# Patient Record
Sex: Male | Born: 1965 | Race: White | Hispanic: No | Marital: Married | Smoking: Former smoker
Health system: Southern US, Community
[De-identification: ages and names within clinical notes are randomized; demographics above are authoritative.]

## PROBLEM LIST (undated history)

## (undated) DIAGNOSIS — B019 Varicella without complication: Secondary | ICD-10-CM

## (undated) DIAGNOSIS — Z8669 Personal history of other diseases of the nervous system and sense organs: Secondary | ICD-10-CM

## (undated) DIAGNOSIS — IMO0002 Reserved for concepts with insufficient information to code with codable children: Secondary | ICD-10-CM

## (undated) DIAGNOSIS — F419 Anxiety disorder, unspecified: Secondary | ICD-10-CM

## (undated) HISTORY — DX: Varicella without complication: B01.9

## (undated) HISTORY — DX: Personal history of other diseases of the nervous system and sense organs: Z86.69

## (undated) HISTORY — DX: Reserved for concepts with insufficient information to code with codable children: IMO0002

## (undated) HISTORY — DX: Anxiety disorder, unspecified: F41.9

---
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
## 1993-06-18 HISTORY — PX: BACK SURGERY: SHX140

## 1997-06-18 HISTORY — PX: APPENDECTOMY: SHX54

## 1998-04-25 ENCOUNTER — Inpatient Hospital Stay (HOSPITAL_COMMUNITY): Admission: EM | Admit: 1998-04-25 | Discharge: 1998-04-27 | Payer: Self-pay | Admitting: Emergency Medicine

## 2001-07-11 ENCOUNTER — Ambulatory Visit (HOSPITAL_BASED_OUTPATIENT_CLINIC_OR_DEPARTMENT_OTHER): Admission: RE | Admit: 2001-07-11 | Discharge: 2001-07-11 | Payer: Self-pay | Admitting: Urology

## 2001-07-11 ENCOUNTER — Encounter (INDEPENDENT_AMBULATORY_CARE_PROVIDER_SITE_OTHER): Payer: Self-pay | Admitting: *Deleted

## 2002-02-22 ENCOUNTER — Emergency Department (HOSPITAL_COMMUNITY): Admission: EM | Admit: 2002-02-22 | Discharge: 2002-02-22 | Payer: Self-pay | Admitting: *Deleted

## 2006-01-09 ENCOUNTER — Ambulatory Visit: Payer: Self-pay | Admitting: Family Medicine

## 2006-02-07 ENCOUNTER — Ambulatory Visit: Payer: Self-pay | Admitting: Family Medicine

## 2006-05-03 ENCOUNTER — Ambulatory Visit: Payer: Self-pay | Admitting: Family Medicine

## 2006-07-17 DIAGNOSIS — F411 Generalized anxiety disorder: Secondary | ICD-10-CM | POA: Insufficient documentation

## 2006-10-24 ENCOUNTER — Ambulatory Visit: Payer: Self-pay | Admitting: Family Medicine

## 2007-06-10 ENCOUNTER — Telehealth (INDEPENDENT_AMBULATORY_CARE_PROVIDER_SITE_OTHER): Payer: Self-pay | Admitting: *Deleted

## 2007-07-09 ENCOUNTER — Ambulatory Visit: Payer: Self-pay | Admitting: Family Medicine

## 2007-07-09 DIAGNOSIS — H833X9 Noise effects on inner ear, unspecified ear: Secondary | ICD-10-CM

## 2008-03-16 ENCOUNTER — Ambulatory Visit: Payer: Self-pay | Admitting: Internal Medicine

## 2008-03-16 DIAGNOSIS — H8110 Benign paroxysmal vertigo, unspecified ear: Secondary | ICD-10-CM

## 2008-04-11 ENCOUNTER — Emergency Department (HOSPITAL_COMMUNITY): Admission: EM | Admit: 2008-04-11 | Discharge: 2008-04-12 | Payer: Self-pay | Admitting: Emergency Medicine

## 2008-08-11 ENCOUNTER — Ambulatory Visit: Payer: Self-pay | Admitting: Internal Medicine

## 2009-04-05 ENCOUNTER — Telehealth: Payer: Self-pay | Admitting: Internal Medicine

## 2009-04-19 ENCOUNTER — Ambulatory Visit: Payer: Self-pay | Admitting: Internal Medicine

## 2009-04-19 DIAGNOSIS — M7710 Lateral epicondylitis, unspecified elbow: Secondary | ICD-10-CM | POA: Insufficient documentation

## 2009-11-18 ENCOUNTER — Ambulatory Visit: Payer: Self-pay | Admitting: Internal Medicine

## 2010-01-25 ENCOUNTER — Encounter: Admission: RE | Admit: 2010-01-25 | Discharge: 2010-01-25 | Payer: Self-pay | Admitting: Chiropractic Medicine

## 2010-04-26 ENCOUNTER — Telehealth: Payer: Self-pay | Admitting: Internal Medicine

## 2010-07-18 NOTE — Progress Notes (Signed)
Summary: needs appt  Phone Note Outgoing Call   Call placed by: Mervin Kung, CMA (AAMA) Call placed to: Patient Summary of Call: Received refill request for Citalopram. Pt is past due for follow up and needs appt.  Left message for pt to return my call. Nicki Guadalajara Fergerson CMA Duncan Dull)  April 26, 2010 10:02 AM   Follow-up for Phone Call        Left message on machine to return my call. Nicki Guadalajara Fergerson CMA Duncan Dull)  April 27, 2010 9:43 AM   Additional Follow-up for Phone Call Additional follow up Details #1::        call placed to patient at  [phone], male individual stated patient was not available. She was advised to have patient call to schedule follow up appointment Additional Follow-up by: Glendell Docker CMA,  April 28, 2010 11:35 AM

## 2010-11-03 NOTE — Op Note (Signed)
[facility]  Patient:    Dalton Moss, Dalton Moss Visit Number: [id number] MRN: [id number]          Service Type: NES Location: [facility] Attending Physician:  Liborio Nixon Dictated by:   Bertram Millard. Dahlstedt, M.D. Proc. Date: 07/11/01 Admit Date:  07/11/2001                             Operative Report  PREOPERATIVE DIAGNOSIS:  Left scrotal mass.  POSTOPERATIVE DIAGNOSIS:  Left scrotal mass.  PRINCIPLE PROCEDURE:  Excision of left scrotal mass.  SURGEON:  Bertram Millard. Dahlstedt, M.D.  ANESTHESIA:  General with LMA.  COMPLICATIONS:  None.  BRIEF HISTORY:  A 45 year old male status post vasectomy several years ago. He has had a persistent, enlarging, painful mass in his left hemiscrotum. This is palpable on a regular basis. He recently presented to me for evaluation. He requested removal of this, due to recurrent pain which is disabling at times. Surgery was discussed with the patient as well as risks and complications. He desires to proceed.  DESCRIPTION OF PROCEDURE:  The patient was administered a general anesthetic using the LMA. He was placed in the supine position. Genitalia and perineum were prepped and draped. A 1 1/2 cm incision was made over top of this left scrotal mass which was transfixed beneath the skin with towel clips. Dissection was carried through some dense subcutaneous tissue to this mass which was identified. It was approximately 1 x 1 1/2 cm in size. It was circumscribed with sharp dissection. Small bleeders were electrocoagulated. There was a small amount of brownish material coming from this mass which was cultured and sent for routine culture and sensitivity. Once the mass was excised, inspection and palpation revealed no further palpable or visual evidence of the mass left. The small bleeders were carefully electrocoagulated. There was a free end of the vas which was identified and sutured closed with 3-0 Vicryl  tie.  The cord was blocked with 0.5% plain Marcaine, 10 cc. Subcutaneous skin closure was performed using a running 4-0 Vicryl. Collodion, fluffs, and a jock strap were placed.  The patient tolerated the procedure well. He was awakened, extubated, and taken to the PACU in stable condition.  He will follow-up in my office in approximately two weeks. Dictated by:   Bertram Millard. Dahlstedt, M.D. Attending Physician:  Liborio Nixon DD:  07/11/01 TD:  07/12/01 Job: [id number]

## 2011-03-03 ENCOUNTER — Emergency Department (HOSPITAL_BASED_OUTPATIENT_CLINIC_OR_DEPARTMENT_OTHER)
Admission: EM | Admit: 2011-03-03 | Discharge: 2011-03-03 | Disposition: A | Discharge status: Home / Self Care | Payer: BC Managed Care – PPO | Attending: Emergency Medicine | Admitting: Emergency Medicine

## 2011-03-03 ENCOUNTER — Emergency Department (INDEPENDENT_AMBULATORY_CARE_PROVIDER_SITE_OTHER): Payer: BC Managed Care – PPO

## 2011-03-03 ENCOUNTER — Encounter: Payer: Self-pay | Admitting: Emergency Medicine

## 2011-03-03 DIAGNOSIS — R05 Cough: Secondary | ICD-10-CM

## 2011-03-03 DIAGNOSIS — J40 Bronchitis, not specified as acute or chronic: Secondary | ICD-10-CM | POA: Insufficient documentation

## 2011-03-03 MED ORDER — AZITHROMYCIN 250 MG PO TABS: ORAL_TABLET | ORAL | Status: DC

## 2011-03-03 NOTE — ED Provider Notes (Signed)
History     CSN: [id number] Arrival date & time: 03/03/2011 12:06 PM   Chief Complaint  Patient presents with  . Cough  . Pleurisy     (Include location/radiation/quality/duration/timing/severity/associated sxs/prior treatment) HPI  Patient states that he has had upper respiratory infection symptoms for several weeks. He has now developed a cough productive of dark sputum. He has had fever and chills especially at night. He states that many of his coworkers have had a similar syndrome. He is on a significant past medical history. History reviewed. No pertinent past medical history.   Past Surgical History  Procedure Date  . Back surgery     History reviewed. No pertinent family history.  History  Substance Use Topics  . Smoking status: Never Smoker   . Smokeless tobacco: Not on file  . Alcohol Use: 8.4 oz/week    14 Cans of beer per week      Review of Systems  All other systems reviewed and are negative.    Allergies  Review of patient's allergies indicates no known allergies.  Home Medications   Current Outpatient Rx  Name Route Sig Dispense Refill  . DEXTROMETHORPHAN-GUAIFENESIN 30-600 MG PO TB12 Oral Take 1 tablet by mouth every 12 (twelve) hours.      . MULTIVITAMINS PO CAPS Oral Take 1 capsule by mouth daily.        Physical Exam    BP 123/70  Pulse 71  Temp(Src) 97.1 F (36.2 C) (Oral)  Resp 16  Ht 6\' 2"  (1.88 m)  Wt 195 lb (88.451 kg)  BMI 25.04 kg/m2  SpO2 99%  Physical Exam  Vitals reviewed. Constitutional: He is oriented to person, place, and time. He appears well-developed and well-nourished.  HENT:  Head: Normocephalic.  Eyes: Conjunctivae are normal. Pupils are equal, round, and reactive to light.  Neck: Normal range of motion. Neck supple.  Cardiovascular: Normal rate.   Pulmonary/Chest: Effort normal.  Abdominal: Soft.  Musculoskeletal: Normal range of motion.  Neurological: He is alert and oriented to person, place, and time.   Skin: Skin is warm and dry.  Psychiatric: He has a normal mood and affect.    ED Course  Procedures  No results found for this or any previous visit. Dg Chest 2 View  03/03/2011  *RADIOLOGY REPORT*  Clinical Data: Cough  CHEST - 2 VIEW  Comparison: None.  Findings: Lungs are clear. No pleural effusion or pneumothorax.  Cardiomediastinal silhouette is within normal limits.  Visualized osseous structures are within normal limits.  IMPRESSION: Normal chest radiographs.  Original Report Authenticated By: Charline Bills, M.D.     No diagnosis found.   MDM        Hilario Quarry, MD 03/03/11 [phone]

## 2011-03-03 NOTE — ED Notes (Signed)
Pt states he has been having cold symptoms x 2 weeks.  Irritated throat, cough, nasal congestion, progressively worsening in to chest congestion, productive cough, productive brown sputum.  No known fever, no chills.

## 2011-05-07 ENCOUNTER — Telehealth: Payer: Self-pay | Admitting: *Deleted

## 2011-05-07 NOTE — Telephone Encounter (Signed)
Agree w/ Tamiflu.

## 2011-05-07 NOTE — Telephone Encounter (Signed)
riage Record Num: [id number] Operator: Dalton Moss Patient Name: Dalton Moss Call Date & Time: 05/05/2011 3:31:25PM Patient Phone: PCP: Dalton Moss Patient Gender: Male PCP Fax : [phone] Patient DOB: 1966-04-10 Practice Name: Dalton Moss Reason for Call: Caller: Dalton Moss/Spouse; PCP: Dalton Moss.; CB#: [phone]; Call Reason: Exposure To Flu; Sx Onset: 05/05/2011; Sx Notes: ; Afebrile; Wt: ; Guideline Used: ; Disp:; Appt Scheduled?: States son was seen at [facility]/05/11 and diagnosed with positive influenza A. Child was prescribed TamiFlu, and provider suggested family call PCP for coverage with TamiFlu. Denies current medications, allergies, or s/sx influenza. TC to Dalton Moss; Rx for Tamiflu 75 mg 1 po qd x 10 days, #10, NR called in to [facility]/[address] [phone]. Protocol(s) Used: Flu-Like Symptoms Recommended Outcome per Protocol: Provide Home/Self Care Reason for Outcome: Has questions/concerns about exposure to influenza Care Advice: ~ HEALTH PROMOTION / MAINTENANCE Influenza - Expected Course: - Symptoms start to improve in 3 to 7 days. - Cough and feeling tired (malaise) may continue for several weeks. - Cough and extreme fatigue lasting more than 3 weeks needs medical evaluation. - Remain at home at least 24 hours after being free of fever 100F (37.8C) without the use of fever reducing medication. ~ Influenza - Transmission: - Flu virus is spread through the air in droplets when a flu-infected person coughs, sneezes or talks and another person breathes in the droplets, or by touching a surface like a door knob, telephone, or keyboard that has been contaminated by the droplets and then touching the mouth, nose, or eyes. - An individual may be passing on the flu before they have any symptoms. - An individual may continue to be contagious with the flu for up to 7 days after the symptoms start. H1N1 influenza may continue  to be contagious as long as cough persists. ~ Influenza Prevention - Good Health Habits: - Practice good health habits: Get 7-8 hours of sleep each night. Eat healthy foods and drink sugar-free fluids. Exercise most days of a week and manage your stress. - Practice prevention by covering your mouth and nose with a tissue when sneezing or coughing and throwing the tissue into the trash after one use. Wash your hands often or use an alcohol-based gel or wipe. Avoid touching your eyes, nose and mouth. - Be sure to keep your immunizations up to date. - Avoid crowds during peak flu season. - Stay at home when not feeling well and avoid close contact with people who are sick. ~ Influenza Prevention - Personal Hygiene: - Wash your hands with soap and water often, for at least 15 seconds, especially after you cough or sneeze or when you have touched a contaminated surface/object (door knob, telephone, etc.) - If soap and water are not available, use an alcohol-based hand cleaner containing at least 60% alcohol, rub hands together until hands are dry. - Avoid putting your hands and fingers to the face, nose, mouth or eyes. - During the flu season avoid crowded places (shopping areas, planes, sporting events). ~ 05/05/2011 3:55:05PM Page 1 of 2 [id number] Call-A-Nurse Triage Call Report Patient Name: Dalton Moss continuation page/s Do not go to work, school, or any community activity until you have not had a fever (100F or 37.8C) for at least 24 hours without taking fever-reducing medication. This means staying at home for at least 3 to 5, possibly 7 days. ~ To help prevent a widespread community outbreak of the flu, call the call center, your  clinic or provider's office to discuss any questions or concerns before going in unless you have severe respiratory or any nervous system symptoms. ~ Influenza - Face Masks Disposable face masks should be used by: - People with flu-like symptoms  when in close contact with others in the home, if breastfeeding a baby, or when leaving the home for medical care - People at high risk for flu-related complications * Disposable face masks are available at pharmacies and hardware or building supply stores * Used face masks can be thrown away in the regular trash. Wash hands with soap and water or with alcohol-based gel after removing a face mask. ~ Antiviral medications - Prescribed medications are available in pills, liquids or inhaled powder that help prevent or lessen symptoms of viral illnesses. Antivirals are usually given to persons at a higher risk for flu-related complications or who are very ill. Most healthy people do not need to be treated with antiviral drugs. - Recommended medications for this season: oseltamivir (Tamiflu) and zanamivir (Relenza). - Work best when started within the first two days of symptoms. - Speak with your provider as soon as possible if exposed to the flu or if you have new symptoms of the flu. ~ Influenza Prevention - Immunization: * Vaccination each season is the best protection against getting the flu and its complications. The most common complication of influenza is pneumonia. * CDC recommends annual flu vaccine for everyone 6 months and older. Flu viruses change quickly so last year's vaccine may not protect you from this year's virus. * Flu vaccine is available in two forms, a shot or a nasal spray. - The shot contains killed viruses so you can't pass the flu to anyone else. It is approved for everyone age 7 months and older. - The nasal spray contains weakened live flu viruses that won't give you the flu but in rare cases can be passed to others. It is approved for healthy persons ages 2 to 56 years. It should NOT be given to pregnant women, those with a chronic medical condition, a weakened immune system, or to children and adolescents receiving aspirin therapy. * DO NOT get a flu shot if  you: - Have had a severe allergic reaction to the vaccine in the past - Have an allergy to chicken eggs - Have a fever that day * Full effect of the vaccination takes two weeks. If you are exposed to the flu virus during the two weeks you may catch the flu. If this year's vaccine does not match the flu viruses you are exposed to during this flu season, the flu shot won't protect you. ~ 11/

## 2011-05-29 ENCOUNTER — Ambulatory Visit: Payer: BC Managed Care – PPO | Admitting: Internal Medicine

## 2011-05-31 ENCOUNTER — Ambulatory Visit: Payer: BC Managed Care – PPO | Admitting: Internal Medicine

## 2011-10-15 ENCOUNTER — Encounter: Payer: Self-pay | Admitting: Internal Medicine

## 2011-10-15 ENCOUNTER — Ambulatory Visit: Payer: BC Managed Care – PPO | Admitting: Internal Medicine

## 2011-10-16 ENCOUNTER — Ambulatory Visit (INDEPENDENT_AMBULATORY_CARE_PROVIDER_SITE_OTHER): Payer: BC Managed Care – PPO | Admitting: Internal Medicine

## 2011-10-16 ENCOUNTER — Encounter: Payer: Self-pay | Admitting: Internal Medicine

## 2011-10-16 VITALS — BP 104/60 | HR 65 | Temp 97.9°F | Ht 74.0 in | Wt 189.0 lb

## 2011-10-16 DIAGNOSIS — Z13220 Encounter for screening for lipoid disorders: Secondary | ICD-10-CM

## 2011-10-16 DIAGNOSIS — R5381 Other malaise: Secondary | ICD-10-CM

## 2011-10-16 DIAGNOSIS — R5383 Other fatigue: Secondary | ICD-10-CM

## 2011-10-16 DIAGNOSIS — M7989 Other specified soft tissue disorders: Secondary | ICD-10-CM

## 2011-10-16 DIAGNOSIS — F411 Generalized anxiety disorder: Secondary | ICD-10-CM

## 2011-10-16 DIAGNOSIS — M799 Soft tissue disorder, unspecified: Secondary | ICD-10-CM

## 2011-10-16 LAB — CBC WITH DIFFERENTIAL/PLATELET
Basophils Relative: 0 % (ref 0–1)
Eosinophils Absolute: 0.1 10*3/uL (ref 0.0–0.7)
HCT: 40.9 % (ref 39.0–52.0)
Hemoglobin: 13.8 g/dL (ref 13.0–17.0)
Lymphs Abs: 1.6 10*3/uL (ref 0.7–4.0)
MCV: 93.2 fL (ref 78.0–100.0)
Monocytes Absolute: 0.4 10*3/uL (ref 0.1–1.0)
Neutrophils Relative %: 41 % — ABNORMAL LOW (ref 43–77)
RBC: 4.39 MIL/uL (ref 4.22–5.81)
RDW: 12.2 % (ref 11.5–15.5)
WBC: 3.4 10*3/uL — ABNORMAL LOW (ref 4.0–10.5)

## 2011-10-16 LAB — LIPID PANEL
Total CHOL/HDL Ratio: 3 Ratio
VLDL: 15 mg/dL (ref 0–40)

## 2011-10-16 LAB — HEPATIC FUNCTION PANEL
ALT: 29 U/L (ref 0–53)
AST: 32 U/L (ref 0–37)
Alkaline Phosphatase: 39 U/L (ref 39–117)
Bilirubin, Direct: 0.2 mg/dL (ref 0.0–0.3)
Indirect Bilirubin: 0.5 mg/dL (ref 0.0–0.9)
Total Bilirubin: 0.7 mg/dL (ref 0.3–1.2)
Total Protein: 7 g/dL (ref 6.0–8.3)

## 2011-10-16 LAB — TSH: TSH: 1.968 u[IU]/mL (ref 0.350–4.500)

## 2011-10-16 MED ORDER — ZOLPIDEM TARTRATE 5 MG PO TABS: 5.0000 mg | ORAL_TABLET | Freq: Every evening | ORAL | Status: DC | PRN

## 2011-10-16 MED ORDER — CITALOPRAM HYDROBROMIDE 20 MG PO TABS: 20.0000 mg | ORAL_TABLET | Freq: Every day | ORAL | Status: DC

## 2011-10-16 NOTE — Progress Notes (Signed)
  Subjective:    Patient ID: Dalton Moss, male    DOB: 10/29/1965, 46 y.o.   MRN: [id number]  HPI Pt presents to clinic for followup of multiple medical problems. Notes lump on right arm described as size of a peanut for ~7 months. No pain, irritation or drainage. No changes in lump noted. H/o anxiety in past and was on celexa previously. Notes decreased sleep. Uses ibuprofen regularly for back pain. States drink etoh on a regular basis. Has generalized fatigue.   Past Medical History  Diagnosis Date  . Anxiety   . History of tinnitus     & hearing loss- Followed by ENT-Dr. Annalee Genta  . Positional vertigo    Past Surgical History  Procedure Date  . Back surgery 1995    x 2  . Appendectomy 1999  . Tonsillectomy 1973    reports that he has quit smoking. He has never used smokeless tobacco. He reports that he drinks about 8.4 ounces of alcohol per week. He reports that he does not use illicit drugs. family history is negative for Heart disease, and Stroke, and Diabetes, and Hypertension, and Breast cancer, and Colon cancer, and Prostate cancer, . No Known Allergies    Review of Systems see hpi     Objective:   Physical Exam  Nursing note and vitals reviewed. Constitutional: He appears well-developed and well-nourished.  HENT:  Head: Normocephalic and atraumatic.  Eyes: Conjunctivae are normal. No scleral icterus.  Neck: Neck supple.  Cardiovascular: Normal rate, regular rhythm and normal heart sounds.   Pulmonary/Chest: Effort normal and breath sounds normal.  Neurological: He is alert.  Skin: Skin is warm and dry.       Right arm: small ~50mm st mass. Mobile, smooth/well circumscribed and nt.  Psychiatric: He has a normal mood and affect. His behavior is normal.          Assessment & Plan:

## 2011-10-21 DIAGNOSIS — M7989 Other specified soft tissue disorders: Secondary | ICD-10-CM | POA: Insufficient documentation

## 2011-10-21 DIAGNOSIS — R5383 Other fatigue: Secondary | ICD-10-CM | POA: Insufficient documentation

## 2011-10-21 NOTE — Assessment & Plan Note (Signed)
Resume celexa. Wean etoh. Follow up one month or sooner if needed.

## 2011-10-21 NOTE — Assessment & Plan Note (Signed)
Obtain cbc, chem7, tsh, testosterone and lft.

## 2011-10-21 NOTE — Assessment & Plan Note (Signed)
Exam suggests cyst vs lipoma. Observation recommended.

## 2011-10-24 ENCOUNTER — Telehealth: Payer: Self-pay | Admitting: *Deleted

## 2011-10-24 NOTE — Telephone Encounter (Signed)
Notes Recorded by Edwyna Perfect, MD on 10/22/2011 at 9:24 PM Labs ok. Missing chem7  Patient's daughter states Pt out of town; informed her that we are still waiting on [1] of the lab results, but what has been received were WNR, and that we would call back after we have researched the other lab results [BMET still stating 'needs to be collected']? Could you please check w/your lab on this & inform patient. Thanks.

## 2011-10-25 NOTE — Telephone Encounter (Signed)
Call placed to patient at [phone], male identified herself as his wife, she stated patient is out of town and will return tomorrow. Message was left for patient to return phone call.

## 2011-11-20 ENCOUNTER — Ambulatory Visit (INDEPENDENT_AMBULATORY_CARE_PROVIDER_SITE_OTHER): Payer: BC Managed Care – PPO | Admitting: Internal Medicine

## 2011-11-20 VITALS — BP 110/72 | HR 63 | Temp 98.3°F | Resp 18 | Wt 186.0 lb

## 2011-11-20 DIAGNOSIS — F411 Generalized anxiety disorder: Secondary | ICD-10-CM

## 2011-11-20 DIAGNOSIS — R5381 Other malaise: Secondary | ICD-10-CM

## 2011-11-20 DIAGNOSIS — R7989 Other specified abnormal findings of blood chemistry: Secondary | ICD-10-CM

## 2011-11-20 DIAGNOSIS — E291 Testicular hypofunction: Secondary | ICD-10-CM

## 2011-11-20 DIAGNOSIS — R5383 Other fatigue: Secondary | ICD-10-CM

## 2011-11-20 LAB — BASIC METABOLIC PANEL
Calcium: 9.7 mg/dL (ref 8.4–10.5)
Creat: 0.96 mg/dL (ref 0.50–1.35)
Potassium: 4.6 mEq/L (ref 3.5–5.3)
Sodium: 141 mEq/L (ref 135–145)

## 2011-11-21 ENCOUNTER — Encounter: Payer: Self-pay | Admitting: Internal Medicine

## 2011-11-21 DIAGNOSIS — R7989 Other specified abnormal findings of blood chemistry: Secondary | ICD-10-CM | POA: Insufficient documentation

## 2011-11-21 LAB — TESTOSTERONE, FREE, TOTAL, SHBG
Sex Hormone Binding: 41 nmol/L (ref 13–71)
Testosterone: 567.07 ng/dL (ref 300–890)

## 2011-11-21 NOTE — Assessment & Plan Note (Signed)
Improving. Continue celexa at current dose.

## 2011-11-21 NOTE — Progress Notes (Signed)
  Subjective:    Patient ID: Dalton Moss, male    DOB: September 25, 1965, 46 y.o.   MRN: [id number]  HPI Pt presents to clinic for followup of multiple medical problems. Anxiety better with celexa. No side effects. Testosterone obtained due to fatigue and found to be low nl. No h/o hypogonadism or testosterone replacement.  Past Medical History  Diagnosis Date  . Anxiety   . History of tinnitus     & hearing loss- Followed by ENT-Dr. Annalee Moss  . Positional vertigo    Past Surgical History  Procedure Date  . Back surgery 1995    x 2  . Appendectomy 1999  . Tonsillectomy 1973    reports that he has quit smoking. He has never used smokeless tobacco. He reports that he drinks about 8.4 ounces of alcohol per week. He reports that he does not use illicit drugs. family history is negative for Heart disease, and Stroke, and Diabetes, and Hypertension, and Breast cancer, and Colon cancer, and Prostate cancer, . No Known Allergies    Review of Systems see hpi     Objective:   Physical Exam  Nursing note and vitals reviewed. Constitutional: He appears well-developed and well-nourished. No distress.  Neurological: He is alert.  Skin: He is not diaphoretic.  Psychiatric: He has a normal mood and affect.          Assessment & Plan:

## 2011-11-21 NOTE — Assessment & Plan Note (Signed)
Obtain free testosterone

## 2011-12-19 ENCOUNTER — Telehealth: Payer: Self-pay | Admitting: Internal Medicine

## 2011-12-19 MED ORDER — ZOLPIDEM TARTRATE 5 MG PO TABS: 5.0000 mg | ORAL_TABLET | Freq: Every evening | ORAL | Status: DC | PRN

## 2011-12-19 NOTE — Telephone Encounter (Signed)
OK to send #30 with no refills. 

## 2011-12-19 NOTE — Telephone Encounter (Signed)
Refill left on pharmacy voicemail. 

## 2011-12-19 NOTE — Telephone Encounter (Signed)
Zolpidem request [last refill 04.30.13 #[id number]]/SLS Please advise in Dr. Ilda Foil absence. Thanks.

## 2011-12-19 NOTE — Telephone Encounter (Signed)
Refill-zolpidem 5mg  tablets. Take one tablet by mouth every night at bedtime as needed for sleep. Qty 30 last fill 4.30.13

## 2012-02-19 ENCOUNTER — Ambulatory Visit: Payer: BC Managed Care – PPO | Admitting: Internal Medicine

## 2012-03-12 ENCOUNTER — Telehealth: Payer: Self-pay | Admitting: Internal Medicine

## 2012-03-12 NOTE — Telephone Encounter (Signed)
Refill- zolpidem 5mg  tablets. Take one tablet by mouth every night at bedtime as needed for sleep. Qty 30 last fill 7.3.13

## 2012-03-13 ENCOUNTER — Ambulatory Visit: Payer: BC Managed Care – PPO | Admitting: Internal Medicine

## 2012-03-13 MED ORDER — ZOLPIDEM TARTRATE 5 MG PO TABS: 5.0000 mg | ORAL_TABLET | Freq: Every evening | ORAL | Status: DC | PRN

## 2012-03-13 NOTE — Telephone Encounter (Signed)
#  [phone] [id number] 

## 2012-03-13 NOTE — Telephone Encounter (Signed)
Rx to pharmacy/SLS 

## 2012-07-14 ENCOUNTER — Encounter: Payer: Self-pay | Admitting: Family

## 2012-07-14 ENCOUNTER — Ambulatory Visit (INDEPENDENT_AMBULATORY_CARE_PROVIDER_SITE_OTHER): Payer: BC Managed Care – PPO | Admitting: Family

## 2012-07-14 VITALS — BP 122/72 | HR 77 | Temp 98.9°F | Resp 16 | Wt 191.0 lb

## 2012-07-14 DIAGNOSIS — H6690 Otitis media, unspecified, unspecified ear: Secondary | ICD-10-CM

## 2012-07-14 MED ORDER — AMOXICILLIN-POT CLAVULANATE 875-125 MG PO TABS: 1.0000 | ORAL_TABLET | Freq: Two times a day (BID) | ORAL | Status: DC

## 2012-07-14 NOTE — Assessment & Plan Note (Signed)
R OM and possible early sinusitis.  Will rx with Augmentin.  Pt is instructed to call if symptoms worsen or if no improvement in 3 days. Pt verbalizes understanding.

## 2012-07-14 NOTE — Patient Instructions (Addendum)
Please call if symptoms worsen or if no improvement in 3 days.

## 2012-07-14 NOTE — Progress Notes (Signed)
  Subjective:    Patient ID: Dalton Moss, male    DOB: 1965-08-29, 47 y.o.   MRN: [id number]  HPI  Dalton Moss is a 47 yr old male who presents today with chief complaint of cough.  He reports associated fatigue, sore throat, low grade fever and nasal congestion which started on Thursday.  He does have some colored nasal discharge.  He has tried dayquil, tylenol, mucinex without improvement.  Review of Systems    see HPI  Past Medical History  Diagnosis Date  . Anxiety   . History of tinnitus     & hearing loss- Followed by ENT-Dr. Annalee Genta  . Positional vertigo     History   Social History  . Marital Status: Married    Spouse Name: N/A    Number of Children: N/A  . Years of Education: N/A   Occupational History  . Not on file.   Social History Main Topics  . Smoking status: Former Games developer  . Smokeless tobacco: Never Used     Comment: history of 1 ppd for 7-8 years. Currently using nicotine gum to assist stopping chewing tobacco.  . Alcohol Use: 8.4 oz/week    14 Cans of beer per week  . Drug Use: No  . Sexually Active: Not on file   Other Topics Concern  . 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 Back surgery 1995    x 2  . Appendectomy 1999  . Tonsillectomy 1973    Family History  Problem Relation Age of Onset  . Heart disease Neg Hx   . Stroke Neg Hx   . Diabetes Neg Hx   . Hypertension Neg Hx   . Breast cancer Neg Hx   . Colon cancer Neg Hx   . Prostate cancer Neg Hx     No Known Allergies  Current Outpatient Prescriptions on File Prior to Visit  Medication Sig Dispense Refill  . citalopram (CELEXA) 20 MG tablet Take 1 tablet (20 mg total) by mouth daily.  30 tablet  11  . Multiple Vitamin (MULTIVITAMIN) capsule Take 1 capsule by mouth daily.        Marland Kitchen zolpidem (AMBIEN) 5 MG tablet Take 1 tablet (5 mg total) by mouth at bedtime as needed  for sleep.  30 tablet  3    BP 122/72  Pulse 77  Temp 98.9 F (37.2 C) (Oral)  Resp 16  Wt 191 lb 0.6 oz (86.655 kg)  SpO2 97%    Objective:   Physical Exam  Constitutional: He is oriented to person, place, and time. He appears well-developed and well-nourished. No distress.  HENT:  Head: Normocephalic and atraumatic.  Right Ear: Ear canal normal. Tympanic membrane is erythematous.  Left Ear: Tympanic membrane and ear canal normal.  Mouth/Throat: No oropharyngeal exudate or posterior oropharyngeal edema.  Cardiovascular: Normal rate and regular rhythm.   No murmur heard. Pulmonary/Chest: Effort normal and breath sounds normal. No respiratory distress. He has no wheezes. He has no rales. He exhibits no tenderness.  Musculoskeletal: He exhibits no edema.  Neurological: He is alert and oriented to person, place, and time.  Psychiatric: He has a normal mood and affect. His behavior is normal. Judgment and thought content normal.          Assessment & Plan:

## 2012-10-09 ENCOUNTER — Telehealth: Payer: Self-pay | Admitting: Internal Medicine

## 2012-10-09 MED ORDER — CITALOPRAM HYDROBROMIDE 20 MG PO TABS: 20.0000 mg | ORAL_TABLET | Freq: Every day | ORAL | Status: DC

## 2012-10-09 NOTE — Telephone Encounter (Signed)
Refill- citalopram 20mg  tablets. Take one tablet by mouth daily. Qty 30 last fill 3.19.14

## 2013-03-13 ENCOUNTER — Other Ambulatory Visit: Payer: Self-pay | Admitting: Family Medicine

## 2013-03-26 ENCOUNTER — Ambulatory Visit (INDEPENDENT_AMBULATORY_CARE_PROVIDER_SITE_OTHER): Payer: BC Managed Care – PPO | Admitting: Physician Assistant

## 2013-03-26 ENCOUNTER — Encounter: Payer: Self-pay | Admitting: Physician Assistant

## 2013-03-26 VITALS — BP 122/82 | HR 59 | Temp 98.5°F | Resp 16 | Ht 73.0 in | Wt 205.0 lb

## 2013-03-26 DIAGNOSIS — F411 Generalized anxiety disorder: Secondary | ICD-10-CM

## 2013-03-26 DIAGNOSIS — Z299 Encounter for prophylactic measures, unspecified: Secondary | ICD-10-CM | POA: Insufficient documentation

## 2013-03-26 DIAGNOSIS — F419 Anxiety disorder, unspecified: Secondary | ICD-10-CM | POA: Insufficient documentation

## 2013-03-26 MED ORDER — CITALOPRAM HYDROBROMIDE 20 MG PO TABS: ORAL_TABLET | ORAL | Status: DC

## 2013-03-26 NOTE — Assessment & Plan Note (Signed)
Patient to return for fasting lab work. Patient also instructed to set up appointment day and time for complete physical examination. Last complete physical examination was 1.5 years ago

## 2013-03-26 NOTE — Assessment & Plan Note (Signed)
Refill citalopram 20 mg. Patient is well controlled on current therapy.  Patient is to obtain fasting lab work.

## 2013-03-26 NOTE — Progress Notes (Signed)
Patient ID: Dalton Moss, male   DOB: Feb 19, 1966, 47 y.o.   MRN: [id number]  Patient is a 47 year old Caucasian gentleman with history of anxiety who presents to the clinic today to transfer care for medication management.    Patient states his anxiety has been well controlled with escitalopram 20 mg tablet daily. States he's been on medication for several years. Denies GI upset or sexual dysfunction.  Denies symptoms of depression including depressed mood and anhedonia. Denies panic attack. States that his anxiety has been more generalized in the past. Anxiety stems from constant stress with work. Patient does own business.  Patient is overdue for complete physical exam and fasting lab work.  Discussed with patient that lab work will need to be obtained in order for [place] to continue his chronic medication management.   Patient also has has a history of mild insomnia associated with his anxiety and stress from work. Has prescription for Ambien 5 mg to take each night at bedtime. Patient states that he rarely takes this medication. States the Ambien does not help because his lack of sleep stems from constant demand at work and phone calls throughout the night.   Past Medical History  Diagnosis Date  . Anxiety   . History of tinnitus     & hearing loss- Followed by ENT-Dr. Annalee Genta  . Positional vertigo   . Chicken pox     Current Outpatient Prescriptions on File Prior to Visit  Medication Sig Dispense Refill  . Multiple Vitamin (MULTIVITAMIN) capsule Take 1 capsule by mouth daily.        Marland Kitchen zolpidem (AMBIEN) 5 MG tablet Take 1 tablet (5 mg total) by mouth at bedtime as needed for sleep.  30 tablet  3   No current facility-administered medications on file prior to visit.    No Known Allergies  Family History  Problem Relation Age of Onset  . Heart disease Neg Hx   . Stroke Neg Hx   . Diabetes Neg Hx   . Hypertension Neg Hx   . Breast cancer Neg Hx   . Colon cancer Neg Hx   . Prostate  cancer Neg Hx     History   Social History  . Marital Status: Married    Spouse Name: N/A    Number of Children: N/A  . Years of Education: N/A   Social History Main Topics  . Smoking status: Former Games developer  . Smokeless tobacco: Current User    Types: Chew     Comment: history of 1 ppd for 7-8 years. Currently using nicotine gum to assist stopping chewing tobacco.  . Alcohol Use: 8.4 oz/week    14 Cans of beer per week  . Drug Use: No  . Sexual Activity: None   Other Topics Concern  . 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 All other review of systems negative.    Filed Vitals:   03/26/13 0926  BP: 122/82  Pulse: 59  Temp: 98.5 F (36.9 C)  Resp: 16   Physical Exam  Constitutional: He is oriented to person, place, and time and well-developed, well-nourished, and in no distress.  HENT:  Head: Normocephalic and atraumatic.  Eyes: Conjunctivae are normal.  Neck: Neck supple. No thyromegaly present.  Cardiovascular: Normal rate, regular rhythm and normal heart sounds.   Pulmonary/Chest: Effort normal and  breath sounds normal. No respiratory distress. He has no wheezes. He has no rales. He exhibits no tenderness.  Neurological: He is alert and oriented to person, place, and time.  Skin: Skin is warm and dry. No rash noted.  Psychiatric: Affect normal.   Assessment/Plan: Anxiety Refill citalopram 20 mg. Patient is well controlled on current therapy.  Patient is to obtain fasting lab work.  Preventive measure Patient to return for fasting lab work. Patient also instructed to set up appointment day and time for complete physical examination. Last complete physical examination was 1.5 years ago

## 2013-03-26 NOTE — Patient Instructions (Signed)
Please return to the office for lab work.  I will need to obtain these if we are to continue prescribing medications.  I will call you with your results.  Please schedule an appointment for a complete physical within the next month.

## 2013-03-30 ENCOUNTER — Other Ambulatory Visit: Payer: Self-pay | Admitting: Family Medicine

## 2013-04-01 NOTE — Telephone Encounter (Signed)
eScribe request for refill on Citalopram Last filled - 09.26.14, #15x0 [Needed OV] Last AEX - 10.09.14 Next AEX - 1 Month/CPE Refill sent per [facility] refill protocol/SLS

## 2013-05-07 IMAGING — CR DG CHEST 2V
2 series · 2 of 2 positions shown · non-contrast
Comparison: None.

CLINICAL DATA: Cough

CHEST - 2 VIEW

[w chest pa]
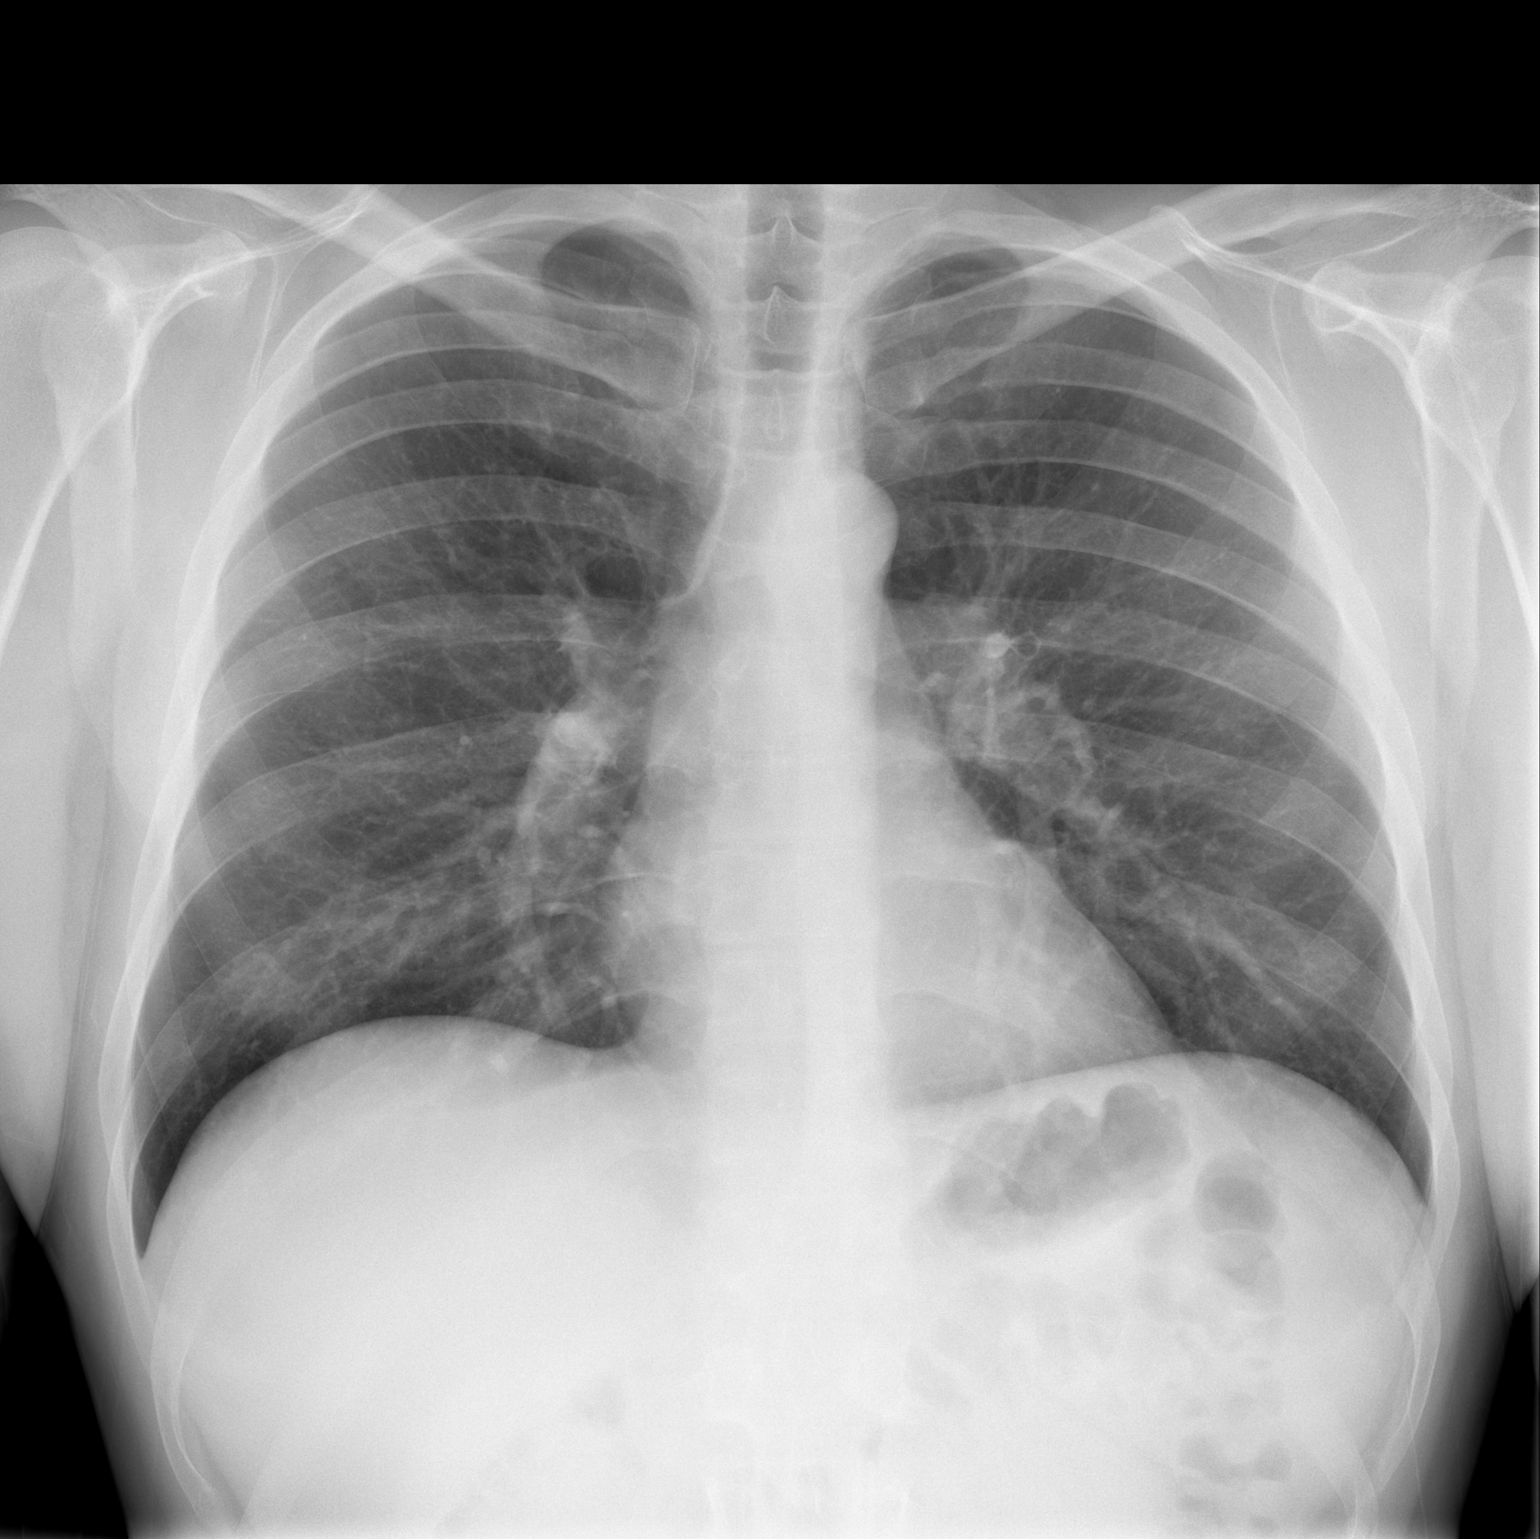

[w chest lat]
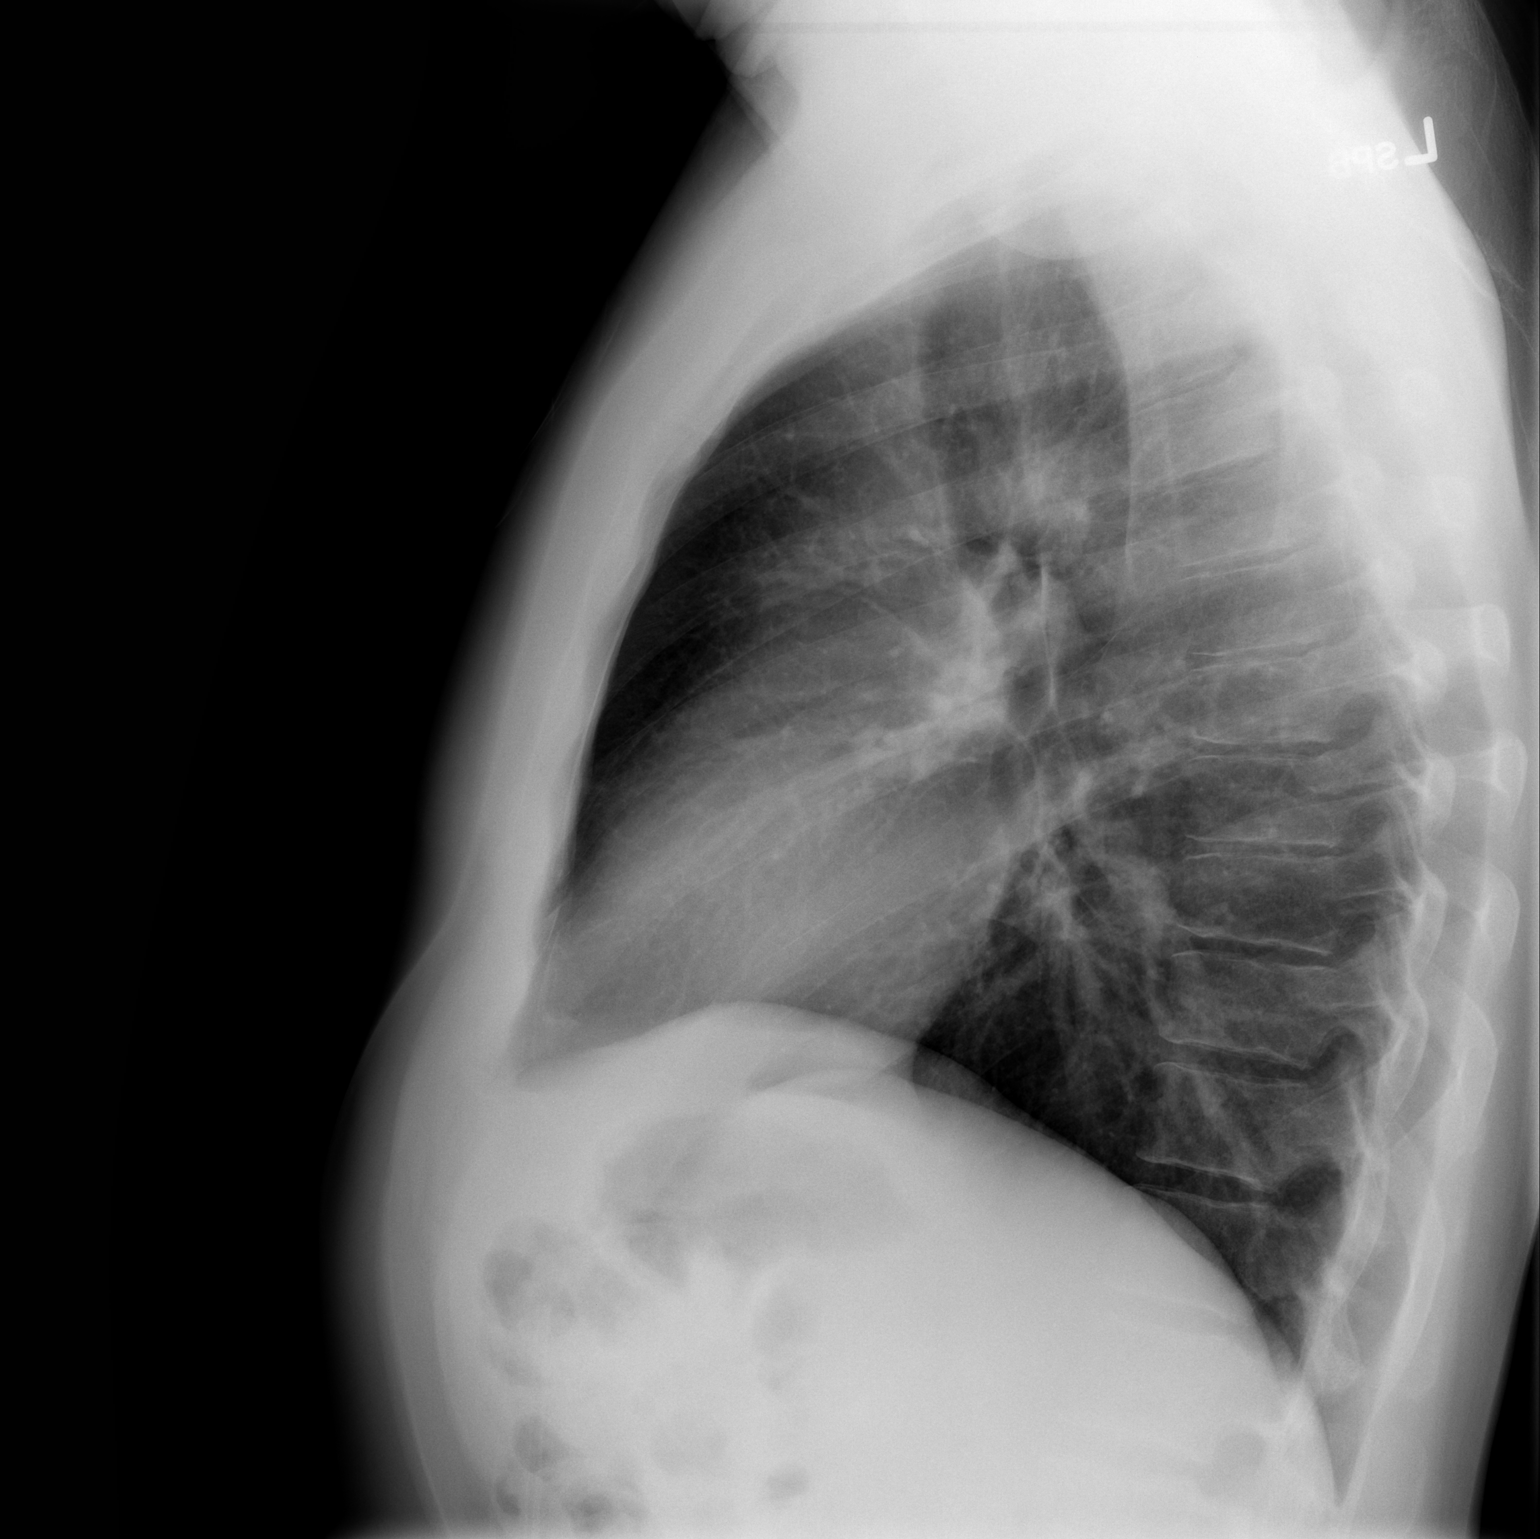

[2 of 2 positions shown; findings below may reference images not displayed]

FINDINGS: Lungs are clear. No pleural effusion or pneumothorax.

Cardiomediastinal silhouette is within normal limits.

Visualized osseous structures are within normal limits.
IMPRESSION: Normal chest radiographs.

## 2013-08-29 ENCOUNTER — Other Ambulatory Visit: Payer: Self-pay | Admitting: Physician Assistant

## 2013-08-31 NOTE — Telephone Encounter (Signed)
[facility] 20 mg Last filled - 10.13.14, #30x1 Last AEX - 10.09.14; NP transfer Next AEX - 4 weeks for CPE; no appointment made and/or kept & pt has not done labs from 10.09.14 appt. Please Advise/SLS

## 2013-09-28 ENCOUNTER — Other Ambulatory Visit: Payer: Self-pay | Admitting: Physician Assistant

## 2013-09-28 NOTE — Telephone Encounter (Signed)
Refill sent per [facility] refill protocol; Ok per verbal order provider; [facility] contact name and number RE: fasting labs overdue per provider instructions/SLS

## 2013-11-25 ENCOUNTER — Other Ambulatory Visit: Payer: Self-pay | Admitting: Physician Assistant

## 2013-11-25 LAB — HEPATIC FUNCTION PANEL
ALK PHOS: 45 U/L (ref 39–117)
ALT: 31 U/L (ref 0–53)
AST: 33 U/L (ref 0–37)
Albumin: 4.4 g/dL (ref 3.5–5.2)
BILIRUBIN INDIRECT: 0.5 mg/dL (ref 0.2–1.2)
Bilirubin, Direct: 0.1 mg/dL (ref 0.0–0.3)
Total Bilirubin: 0.6 mg/dL (ref 0.2–1.2)
Total Protein: 7.1 g/dL (ref 6.0–8.3)

## 2013-11-25 LAB — LIPID PANEL
Cholesterol: 228 mg/dL — ABNORMAL HIGH (ref 0–200)
HDL: 65 mg/dL (ref >39)
LDL CALC: 137 mg/dL — AB (ref 0–99)
TRIGLYCERIDES: 132 mg/dL (ref <150)
Total CHOL/HDL Ratio: 3.5 Ratio
VLDL: 26 mg/dL (ref 0–40)

## 2013-11-25 LAB — CBC WITH DIFFERENTIAL/PLATELET
BASOS ABS: 0 10*3/uL (ref 0.0–0.1)
BASOS PCT: 0 % (ref 0–1)
EOS PCT: 2 % (ref 0–5)
Eosinophils Absolute: 0.1 10*3/uL (ref 0.0–0.7)
HEMATOCRIT: 40 % (ref 39.0–52.0)
HEMOGLOBIN: 14 g/dL (ref 13.0–17.0)
LYMPHS ABS: 1.8 10*3/uL (ref 0.7–4.0)
LYMPHS PCT: 39 % (ref 12–46)
MCH: 31.5 pg (ref 26.0–34.0)
MCHC: 35 g/dL (ref 30.0–36.0)
MCV: 90.1 fL (ref 78.0–100.0)
MONO ABS: 0.4 10*3/uL (ref 0.1–1.0)
MONOS PCT: 8 % (ref 3–12)
Neutro Abs: 2.3 10*3/uL (ref 1.7–7.7)
Neutrophils Relative %: 51 % (ref 43–77)
PLATELETS: 226 10*3/uL (ref 150–400)
RBC: 4.44 MIL/uL (ref 4.22–5.81)
RDW: 13.4 % (ref 11.5–15.5)
WBC: 4.5 10*3/uL (ref 4.0–10.5)

## 2013-11-25 LAB — BASIC METABOLIC PANEL
BUN: 13 mg/dL (ref 6–23)
CHLORIDE: 103 meq/L (ref 96–112)
CO2: 28 mEq/L (ref 19–32)
CREATININE: 0.84 mg/dL (ref 0.50–1.35)
Calcium: 9.6 mg/dL (ref 8.4–10.5)
Glucose, Bld: 93 mg/dL (ref 70–99)
POTASSIUM: 4.6 meq/L (ref 3.5–5.3)
SODIUM: 138 meq/L (ref 135–145)

## 2013-11-25 LAB — TSH: TSH: 1.372 u[IU]/mL (ref 0.350–4.500)

## 2013-11-25 LAB — HEMOGLOBIN A1C
Hgb A1c MFr Bld: 5.4 % (ref <5.7)
MEAN PLASMA GLUCOSE: 108 mg/dL (ref <117)

## 2013-11-26 LAB — URINALYSIS, ROUTINE W REFLEX MICROSCOPIC
Bilirubin Urine: NEGATIVE
Glucose, UA: NEGATIVE mg/dL
Hgb urine dipstick: NEGATIVE
Ketones, ur: NEGATIVE mg/dL
LEUKOCYTES UA: NEGATIVE
Nitrite: NEGATIVE
Protein, ur: NEGATIVE mg/dL
Specific Gravity, Urine: 1.005 (ref 1.005–1.030)
Urobilinogen, UA: 0.2 mg/dL (ref 0.0–1.0)
pH: 5.5 (ref 5.0–8.0)

## 2013-11-26 LAB — PSA: PSA: 0.87 ng/mL (ref <=4.00)

## 2013-11-27 ENCOUNTER — Ambulatory Visit (INDEPENDENT_AMBULATORY_CARE_PROVIDER_SITE_OTHER): Payer: Managed Care, Other (non HMO) | Admitting: Physician Assistant

## 2013-11-27 ENCOUNTER — Encounter: Payer: Self-pay | Admitting: Physician Assistant

## 2013-11-27 VITALS — BP 102/68 | HR 60 | Temp 98.0°F | Resp 16 | Ht 73.0 in | Wt 200.0 lb

## 2013-11-27 DIAGNOSIS — E7800 Pure hypercholesterolemia, unspecified: Secondary | ICD-10-CM

## 2013-11-27 DIAGNOSIS — F411 Generalized anxiety disorder: Secondary | ICD-10-CM

## 2013-11-27 DIAGNOSIS — F419 Anxiety disorder, unspecified: Secondary | ICD-10-CM

## 2013-11-27 DIAGNOSIS — G4700 Insomnia, unspecified: Secondary | ICD-10-CM

## 2013-11-27 MED ORDER — ESZOPICLONE 2 MG PO TABS: 2.0000 mg | ORAL_TABLET | Freq: Every evening | ORAL | Status: DC | PRN

## 2013-11-27 MED ORDER — CITALOPRAM HYDROBROMIDE 40 MG PO TABS: ORAL_TABLET | ORAL | Status: DC

## 2013-11-27 NOTE — Progress Notes (Signed)
Patient presents to clinic today for medication refills and to discuss lab results.  Patient endorses taking his citalopram as directed.  States he still feels irritable and anxious.  Has been on current dose of medication for "a long time". Denies depressed mood, SI/HI.  Takes occasional Ambien to help with sleep.   Past Medical History  Diagnosis Date  . Anxiety   . History of tinnitus     & hearing loss- Followed by ENT-Dr. Wilburn Cornelia  . Positional vertigo   . Chicken pox     Current Outpatient Prescriptions on File Prior to Visit  Medication Sig Dispense Refill  . Multiple Vitamin (MULTIVITAMIN) capsule Take 1 capsule by mouth daily.        . [DISCONTINUED] zolpidem (AMBIEN) 5 MG tablet Take 1 tablet (5 mg total) by mouth at bedtime as needed for sleep.  30 tablet  3   No current facility-administered medications on file prior to visit.    No Known Allergies  Family History  Problem Relation Age of Onset  . Heart disease Neg Hx   . Stroke Neg Hx   . Diabetes Neg Hx   . Hypertension Neg Hx   . Breast cancer Neg Hx   . Colon cancer Neg Hx   . Prostate cancer Neg Hx     History   Social History  . Marital Status: Married    Spouse Name: N/A    Number of Children: N/A  . Years of Education: N/A   Social History Main Topics  . Smoking status: Former Research scientist (life sciences)  . Smokeless tobacco: Current User    Types: Chew     Comment: history of 1 ppd for 7-8 years. Currently using nicotine gum to assist stopping chewing tobacco.  . Alcohol Use: 8.4 oz/week    14 Cans of beer per week  . Drug Use: No  . Sexual Activity: None   Other Topics Concern  . 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  All other ROS are negative.  BP 102/68  Pulse 60  Temp(Src) 98 F (36.7 C) (Oral)  Resp 16  Ht $R'6\' 1"'nd$  (1.854 m)  Wt 200 lb (90.719 kg)  BMI 26.39 kg/m2  SpO2 99%  Physical Exam   Constitutional: He is oriented to person, place, and time and well-developed, well-nourished, and in no distress.  HENT:  Head: Normocephalic and atraumatic.  Eyes: Conjunctivae are normal. Pupils are equal, round, and reactive to light.  Neck: Neck supple.  Cardiovascular: Normal rate, regular rhythm, normal heart sounds and intact distal pulses.   Pulmonary/Chest: Effort normal and breath sounds normal. No respiratory distress. He has no wheezes. He has no rales. He exhibits no tenderness.  Neurological: He is alert and oriented to person, place, and time.  Skin: Skin is warm and dry. No rash noted.  Psychiatric: Affect normal.    Recent Results (from the past 2160 hour(s))  CBC WITH DIFFERENTIAL     Status: None   Collection Time    11/25/13 10:14 AM      Result Value Ref Range   WBC 4.5  4.0 - 10.5 K/uL   RBC 4.44  4.22 - 5.81 MIL/uL   Hemoglobin 14.0  13.0 - 17.0 g/dL   HCT 40.0  39.0 - 52.0 %   MCV 90.1  78.0 - 100.0 fL  MCH 31.5  26.0 - 34.0 pg   MCHC 35.0  30.0 - 36.0 g/dL   RDW 13.4  11.5 - 15.5 %   Platelets 226  150 - 400 K/uL   Neutrophils Relative % 51  43 - 77 %   Neutro Abs 2.3  1.7 - 7.7 K/uL   Lymphocytes Relative 39  12 - 46 %   Lymphs Abs 1.8  0.7 - 4.0 K/uL   Monocytes Relative 8  3 - 12 %   Monocytes Absolute 0.4  0.1 - 1.0 K/uL   Eosinophils Relative 2  0 - 5 %   Eosinophils Absolute 0.1  0.0 - 0.7 K/uL   Basophils Relative 0  0 - 1 %   Basophils Absolute 0.0  0.0 - 0.1 K/uL   Smear Review Criteria for review not met    BASIC METABOLIC PANEL     Status: None   Collection Time    11/25/13 10:14 AM      Result Value Ref Range   Sodium 138  135 - 145 mEq/L   Potassium 4.6  3.5 - 5.3 mEq/L   Chloride 103  96 - 112 mEq/L   CO2 28  19 - 32 mEq/L   Glucose, Bld 93  70 - 99 mg/dL   BUN 13  6 - 23 mg/dL   Creat 0.84  0.50 - 1.35 mg/dL   Calcium 9.6  8.4 - 10.5 mg/dL  LIPID PANEL     Status: Abnormal   Collection Time    11/25/13 10:14 AM      Result  Value Ref Range   Cholesterol 228 (*) 0 - 200 mg/dL   Comment: ATP III Classification:           < 200        mg/dL        Desirable          200 - 239     mg/dL        Borderline High          >= 240        mg/dL        High         Triglycerides 132  <150 mg/dL   HDL 65  >39 mg/dL   Total CHOL/HDL Ratio 3.5     VLDL 26  0 - 40 mg/dL   LDL Cholesterol 137 (*) 0 - 99 mg/dL   Comment:       Total Cholesterol/HDL Ratio:CHD Risk                            Coronary Heart Disease Risk Table                                            Men       Women              1/2 Average Risk              3.4        3.3                  Average Risk              5.0        4.4  2X Average Risk              9.6        7.1               3X Average Risk             23.4       11.0     Use the calculated Patient Ratio above and the CHD Risk table      to determine the patient's CHD Risk.     ATP III Classification (LDL):           < 100        mg/dL         Optimal          100 - 129     mg/dL         Near or Above Optimal          130 - 159     mg/dL         Borderline High          160 - 189     mg/dL         High           > 190        mg/dL         Very High        HEPATIC FUNCTION PANEL     Status: None   Collection Time    11/25/13 10:14 AM      Result Value Ref Range   Total Bilirubin 0.6  0.2 - 1.2 mg/dL   Bilirubin, Direct 0.1  0.0 - 0.3 mg/dL   Indirect Bilirubin 0.5  0.2 - 1.2 mg/dL   Alkaline Phosphatase 45  39 - 117 U/L   AST 33  0 - 37 U/L   ALT 31  0 - 53 U/L   Total Protein 7.1  6.0 - 8.3 g/dL   Albumin 4.4  3.5 - 5.2 g/dL  TSH     Status: None   Collection Time    11/25/13 10:14 AM      Result Value Ref Range   TSH 1.372  0.350 - 4.500 uIU/mL  HEMOGLOBIN A1C     Status: None   Collection Time    11/25/13 10:14 AM      Result Value Ref Range   Hemoglobin A1C 5.4  <5.7 %   Comment:                                                                             According to the ADA Clinical Practice Recommendations for 2011, when     HbA1c is used as a screening test:             >=6.5%   Diagnostic of Diabetes Mellitus                (if abnormal result is confirmed)           5.7-6.4%   Increased risk of developing Diabetes Mellitus           References:Diagnosis and Classification of Diabetes Mellitus,Diabetes     NIOE,7035,00(XFGHW  1):S62-S69 and Standards of Medical Care in             Diabetes - 2011,Diabetes ZLDJ,5701,77 (Suppl 1):S11-S61.         Mean Plasma Glucose 108  <117 mg/dL  PSA     Status: None   Collection Time    11/25/13 10:14 AM      Result Value Ref Range   PSA 0.87  <=4.00 ng/mL   Comment: Test Methodology: ECLIA PSA (Electrochemiluminescence Immunoassay)           For PSA values from 2.5-4.0, particularly in younger men <60 years     old, the AUA and NCCN suggest testing for % Free PSA (3515) and     evaluation of the rate of increase in PSA (PSA velocity).  URINALYSIS, ROUTINE W REFLEX MICROSCOPIC     Status: None   Collection Time    11/25/13 10:14 AM      Result Value Ref Range   Color, Urine YELLOW  YELLOW   APPearance CLEAR  CLEAR   Specific Gravity, Urine 1.005  1.005 - 1.030   pH 5.5  5.0 - 8.0   Glucose, UA NEG  NEG mg/dL   Bilirubin Urine NEG  NEG   Ketones, ur NEG  NEG mg/dL   Hgb urine dipstick NEG  NEG   Protein, ur NEG  NEG mg/dL   Urobilinogen, UA 0.2  0.0 - 1.0 mg/dL   Nitrite NEG  NEG   Leukocytes, UA NEG  NEG    Assessment/Plan: Anxiety Needing better control.  Will increase Citalopram to 40 mg daily. Follow-up 1 month.  Insomnia Switched from Ambien 5 mg to Lunesta 2 mg due to cost with new insurance plan.  Follow-up in 1 month.  Elevated LDL cholesterol level Mild elevation.  Not currently requiring pharmacotherapy.  Discussed diet and TLC.  Patient will attempt to increase cardiovascular exercise and eat a low saturated fat and cholesterol diet.  Will monitor lipids routinely.

## 2013-11-27 NOTE — Assessment & Plan Note (Signed)
Needing better control.  Will increase Citalopram to 40 mg daily. Follow-up 1 month.

## 2013-11-27 NOTE — Patient Instructions (Signed)
Overall your labs look great.  Your LDL (lousy) cholesterol is a little high.  Try to limit intake of foods high in cholesterol and saturated fats.  Increase cardio exercise. We will monitor your cholesterol yearly.  For Anxiety -- take Citalopram as directed.  Follow-up in 1-2 months so we can assess how you are doing on the new dose.  Return sooner if needed.

## 2013-11-27 NOTE — Assessment & Plan Note (Signed)
Switched from Ambien 5 mg to Lunesta 2 mg due to cost with new insurance plan.  Follow-up in 1 month.

## 2013-11-27 NOTE — Progress Notes (Signed)
Pre visit review using our clinic review tool, if applicable. No additional management support is needed unless otherwise documented below in the visit note/SLS  

## 2013-11-27 NOTE — Assessment & Plan Note (Signed)
Mild elevation.  Not currently requiring pharmacotherapy.  Discussed diet and TLC.  Patient will attempt to increase cardiovascular exercise and eat a low saturated fat and cholesterol diet.  Will monitor lipids routinely.

## 2014-01-06 ENCOUNTER — Encounter: Payer: Self-pay | Admitting: Physician Assistant

## 2014-01-06 ENCOUNTER — Ambulatory Visit (INDEPENDENT_AMBULATORY_CARE_PROVIDER_SITE_OTHER): Payer: Managed Care, Other (non HMO) | Admitting: Physician Assistant

## 2014-01-06 VITALS — BP 106/72 | HR 60 | Temp 98.1°F | Resp 16 | Ht 73.0 in | Wt 188.8 lb

## 2014-01-06 DIAGNOSIS — G4700 Insomnia, unspecified: Secondary | ICD-10-CM

## 2014-01-06 DIAGNOSIS — F411 Generalized anxiety disorder: Secondary | ICD-10-CM

## 2014-01-06 DIAGNOSIS — F419 Anxiety disorder, unspecified: Secondary | ICD-10-CM

## 2014-01-06 NOTE — Patient Instructions (Signed)
Please continue current medication regimen.  I am glad you are doing well.  Follow up in October for your yearly exam.  Return sooner if needed.

## 2014-01-06 NOTE — Assessment & Plan Note (Signed)
Doing well with Lunesta.  Continue current regimen.  Follow-up in 3 months.

## 2014-01-06 NOTE — Progress Notes (Signed)
Pre visit review using our clinic review tool, if applicable. No additional management support is needed unless otherwise documented below in the visit note/SLS  

## 2014-01-06 NOTE — Progress Notes (Signed)
Patient presents to clinic today for follow-up of anxiety and insomnia.  Patient doing well since increase of citalopram to 40 mg daily. Denies SI/HI or depressed mood.  Endorses improvement in anxiety.  Patient also doing well with sleep with switch to [facility].  Denies excessive daytime grogginess.  Endorses restful sleep.   Past Medical History  Diagnosis Date  . Anxiety   . History of tinnitus     & hearing loss- Followed by ENT-Dr. Wilburn Cornelia  . Positional vertigo   . Chicken pox     Current Outpatient Prescriptions on File Prior to Visit  Medication Sig Dispense Refill  . eszopiclone (LUNESTA) 2 MG TABS tablet Take 1 tablet (2 mg total) by mouth at bedtime as needed for sleep. Take immediately before bedtime  30 tablet  1  . Multiple Vitamin (MULTIVITAMIN) capsule Take 1 capsule by mouth daily.        . [DISCONTINUED] zolpidem (AMBIEN) 5 MG tablet Take 1 tablet (5 mg total) by mouth at bedtime as needed for sleep.  30 tablet  3   No current facility-administered medications on file prior to visit.    No Known Allergies  Family History  Problem Relation Age of Onset  . Heart disease Neg Hx   . Stroke Neg Hx   . Diabetes Neg Hx   . Hypertension Neg Hx   . Breast cancer Neg Hx   . Colon cancer Neg Hx   . Prostate cancer Neg Hx     History   Social History  . Marital Status: Married    Spouse Name: N/A    Number of Children: N/A  . Years of Education: N/A   Social History Main Topics  . Smoking status: Former Research scientist (life sciences)  . Smokeless tobacco: Current User    Types: Chew     Comment: history of 1 ppd for 7-8 years. Currently using nicotine gum to assist stopping chewing tobacco.  . Alcohol Use: 8.4 oz/week    14 Cans of beer per week  . Drug Use: No  . Sexual Activity: None   Other Topics Concern  . 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  All other ROS are  negative.  BP 106/72  Pulse 60  Temp(Src) 98.1 F (36.7 C) (Oral)  Resp 16  Ht $R'6\' 1"'gr$  (1.854 m)  Wt 188 lb 12 oz (85.616 kg)  BMI 24.91 kg/m2  SpO2 98%  Physical Exam  Constitutional: He is oriented to person, place, and time and well-developed, well-nourished, and in no distress.  HENT:  Head: Normocephalic and atraumatic.  Eyes: Conjunctivae are normal.  Cardiovascular: Normal rate, regular rhythm, normal heart sounds and intact distal pulses.   Pulmonary/Chest: Effort normal and breath sounds normal. No respiratory distress. He has no wheezes. He has no rales. He exhibits no tenderness.  Neurological: He is alert and oriented to person, place, and time.  Skin: Skin is warm and dry. No rash noted.  Psychiatric: Affect normal.    Recent Results (from the past 2160 hour(s))  CBC WITH DIFFERENTIAL     Status: None   Collection Time    11/25/13 10:14 AM      Result Value Ref Range   WBC 4.5  4.0 - 10.5 K/uL   RBC 4.44  4.22 - 5.81 MIL/uL   Hemoglobin 14.0  13.0 -  17.0 g/dL   HCT 40.0  39.0 - 52.0 %   MCV 90.1  78.0 - 100.0 fL   MCH 31.5  26.0 - 34.0 pg   MCHC 35.0  30.0 - 36.0 g/dL   RDW 13.4  11.5 - 15.5 %   Platelets 226  150 - 400 K/uL   Neutrophils Relative % 51  43 - 77 %   Neutro Abs 2.3  1.7 - 7.7 K/uL   Lymphocytes Relative 39  12 - 46 %   Lymphs Abs 1.8  0.7 - 4.0 K/uL   Monocytes Relative 8  3 - 12 %   Monocytes Absolute 0.4  0.1 - 1.0 K/uL   Eosinophils Relative 2  0 - 5 %   Eosinophils Absolute 0.1  0.0 - 0.7 K/uL   Basophils Relative 0  0 - 1 %   Basophils Absolute 0.0  0.0 - 0.1 K/uL   Smear Review Criteria for review not met    BASIC METABOLIC PANEL     Status: None   Collection Time    11/25/13 10:14 AM      Result Value Ref Range   Sodium 138  135 - 145 mEq/L   Potassium 4.6  3.5 - 5.3 mEq/L   Chloride 103  96 - 112 mEq/L   CO2 28  19 - 32 mEq/L   Glucose, Bld 93  70 - 99 mg/dL   BUN 13  6 - 23 mg/dL   Creat 0.84  0.50 - 1.35 mg/dL   Calcium  9.6  8.4 - 10.5 mg/dL  LIPID PANEL     Status: Abnormal   Collection Time    11/25/13 10:14 AM      Result Value Ref Range   Cholesterol 228 (*) 0 - 200 mg/dL   Comment: ATP III Classification:           < 200        mg/dL        Desirable          200 - 239     mg/dL        Borderline High          >= 240        mg/dL        High         Triglycerides 132  <150 mg/dL   HDL 65  >39 mg/dL   Total CHOL/HDL Ratio 3.5     VLDL 26  0 - 40 mg/dL   LDL Cholesterol 137 (*) 0 - 99 mg/dL   Comment:       Total Cholesterol/HDL Ratio:CHD Risk                            Coronary Heart Disease Risk Table                                            Men       Women              1/2 Average Risk              3.4        3.3                  Average Risk  5.0        4.4               2X Average Risk              9.6        7.1               3X Average Risk             23.4       11.0     Use the calculated Patient Ratio above and the CHD Risk table      to determine the patient's CHD Risk.     ATP III Classification (LDL):           < 100        mg/dL         Optimal          100 - 129     mg/dL         Near or Above Optimal          130 - 159     mg/dL         Borderline High          160 - 189     mg/dL         High           > 190        mg/dL         Very High        HEPATIC FUNCTION PANEL     Status: None   Collection Time    11/25/13 10:14 AM      Result Value Ref Range   Total Bilirubin 0.6  0.2 - 1.2 mg/dL   Bilirubin, Direct 0.1  0.0 - 0.3 mg/dL   Indirect Bilirubin 0.5  0.2 - 1.2 mg/dL   Alkaline Phosphatase 45  39 - 117 U/L   AST 33  0 - 37 U/L   ALT 31  0 - 53 U/L   Total Protein 7.1  6.0 - 8.3 g/dL   Albumin 4.4  3.5 - 5.2 g/dL  TSH     Status: None   Collection Time    11/25/13 10:14 AM      Result Value Ref Range   TSH 1.372  0.350 - 4.500 uIU/mL  HEMOGLOBIN A1C     Status: None   Collection Time    11/25/13 10:14 AM      Result Value Ref Range    Hemoglobin A1C 5.4  <5.7 %   Comment:                                                                            According to the ADA Clinical Practice Recommendations for 2011, when     HbA1c is used as a screening test:             >=6.5%   Diagnostic of Diabetes Mellitus                (if abnormal result is confirmed)           5.7-6.4%   Increased risk of developing  Diabetes Mellitus           References:Diagnosis and Classification of Diabetes Mellitus,Diabetes     SFKC,1275,17(GYFVC 1):S62-S69 and Standards of Medical Care in             Diabetes - 2011,Diabetes BSWH,6759,16 (Suppl 1):S11-S61.         Mean Plasma Glucose 108  <117 mg/dL  PSA     Status: None   Collection Time    11/25/13 10:14 AM      Result Value Ref Range   PSA 0.87  <=4.00 ng/mL   Comment: Test Methodology: ECLIA PSA (Electrochemiluminescence Immunoassay)           For PSA values from 2.5-4.0, particularly in younger men <60 years     old, the AUA and NCCN suggest testing for % Free PSA (3515) and     evaluation of the rate of increase in PSA (PSA velocity).  URINALYSIS, ROUTINE W REFLEX MICROSCOPIC     Status: None   Collection Time    11/25/13 10:14 AM      Result Value Ref Range   Color, Urine YELLOW  YELLOW   APPearance CLEAR  CLEAR   Specific Gravity, Urine 1.005  1.005 - 1.030   pH 5.5  5.0 - 8.0   Glucose, UA NEG  NEG mg/dL   Bilirubin Urine NEG  NEG   Ketones, ur NEG  NEG mg/dL   Hgb urine dipstick NEG  NEG   Protein, ur NEG  NEG mg/dL   Urobilinogen, UA 0.2  0.0 - 1.0 mg/dL   Nitrite NEG  NEG   Leukocytes, UA NEG  NEG    Assessment/Plan: Anxiety Doing well.  Continue current regimen.  Follow-up in 3 months.  Insomnia Doing well with Lunesta.  Continue current regimen.  Follow-up in 3 months.

## 2014-01-06 NOTE — Assessment & Plan Note (Signed)
Doing well. Continue current regimen. Followup in 3 months. 

## 2014-03-02 ENCOUNTER — Telehealth: Payer: Self-pay | Admitting: Physician Assistant

## 2014-03-02 MED ORDER — CITALOPRAM HYDROBROMIDE 40 MG PO TABS: 40.0000 mg | ORAL_TABLET | Freq: Every day | ORAL | Status: DC

## 2014-03-02 NOTE — Telephone Encounter (Signed)
Caller name: Koi Relation to ZO:XWRU Call back number: [phone] Pharmacy: [facility]  Reason for call:  Patient is requesting a refill of celexa

## 2014-03-02 NOTE — Telephone Encounter (Signed)
citalopram (CELEXA) 40 MG tablet [[id number]]        Order Details      Dose, Route, Frequency: As Directed      Dispense Quantity:  30 tablet Refills:  1 Fills Remaining:  1                 Sig: Take 1/2 tablet by mouth daily for 2 weeks.  Then increase to 1 tablet daily.       Written Date:  11/27/13 Expiration Date:  11/27/14        Start Date:  11/27/13 End Date:  01/06/14        Ordering Provider:  -- Authorizing Provider:  Waldon Merl, PA-C Ordering User:  Waldon Merl, PA-C                 Diagnosis Association: Anxiety (300.00)    Refill sent per [facility] refill protocol/SLS

## 2014-04-02 ENCOUNTER — Other Ambulatory Visit: Payer: Self-pay | Admitting: Physician Assistant

## 2014-04-02 NOTE — Telephone Encounter (Signed)
Refill sent per [facility] refill protocol/SLS  

## 2014-05-04 ENCOUNTER — Other Ambulatory Visit: Payer: Self-pay | Admitting: Physician Assistant

## 2014-05-04 NOTE — Telephone Encounter (Signed)
[facility] Last filled - 10.16.15, #30x0 Last AEX - 07.22.15 Next AEX - 3 Mths. Please Advise on refills/SLS

## 2014-05-26 ENCOUNTER — Ambulatory Visit: Payer: Managed Care, Other (non HMO) | Admitting: Physician Assistant

## 2014-07-26 ENCOUNTER — Other Ambulatory Visit: Payer: Self-pay | Admitting: Dermatology

## 2014-08-04 ENCOUNTER — Other Ambulatory Visit: Payer: Self-pay | Admitting: Physician Assistant

## 2014-08-11 ENCOUNTER — Ambulatory Visit (INDEPENDENT_AMBULATORY_CARE_PROVIDER_SITE_OTHER): Payer: Managed Care, Other (non HMO) | Admitting: Physician Assistant

## 2014-08-11 ENCOUNTER — Encounter: Payer: Self-pay | Admitting: Physician Assistant

## 2014-08-11 VITALS — BP 122/77 | HR 86 | Temp 98.4°F | Resp 16 | Ht 73.0 in | Wt 211.2 lb

## 2014-08-11 DIAGNOSIS — F419 Anxiety disorder, unspecified: Secondary | ICD-10-CM

## 2014-08-11 MED ORDER — CITALOPRAM HYDROBROMIDE 40 MG PO TABS: 40.0000 mg | ORAL_TABLET | Freq: Every day | ORAL | Status: DC

## 2014-08-11 NOTE — Progress Notes (Signed)
    Patient presents to clinic today for medication management.  Patient currently on Citalopram 40 mg daily for anxiety.  Endorses good relief of symptoms.  Denies SI/HI.  Is taking Lunesta 2 mg PRN to help with insomnia.  Is not having to take regularly.  Past Medical History  Diagnosis Date  . Anxiety   . History of tinnitus     & hearing loss- Followed by ENT-Dr. Annalee GentaShoemaker  . Positional vertigo   . Chicken pox     Current Outpatient Prescriptions on File Prior to Visit  Medication Sig Dispense Refill  . eszopiclone (LUNESTA) 2 MG TABS tablet Take 1 tablet (2 mg total) by mouth at bedtime as needed for sleep. Take immediately before bedtime 30 tablet 1  . Multiple Vitamin (MULTIVITAMIN) capsule Take 1 capsule by mouth daily.      . [DISCONTINUED] zolpidem (AMBIEN) 5 MG tablet Take 1 tablet (5 mg total) by mouth at bedtime as needed for sleep. 30 tablet 3   No current facility-administered medications on file prior to visit.    No Known Allergies  Family History  Problem Relation Age of Onset  . Heart disease Neg Hx   . Stroke Neg Hx   . Diabetes Neg Hx   . Hypertension Neg Hx   . Breast cancer Neg Hx   . Colon cancer Neg Hx   . Prostate cancer Neg Hx     History   Social History  . Marital Status: Married    Spouse Name: N/A  . Number of Children: N/A  . Years of Education: N/A   Social History Main Topics  . Smoking status: Former Games developermoker  . Smokeless tobacco: Current User    Types: Chew     Comment: history of 1 ppd for 7-8 years. Currently using nicotine gum to assist stopping chewing tobacco.  . Alcohol Use: 8.4 oz/week    14 Cans of beer per week  . Drug Use: No  . Sexual Activity: Not on file   Other Topics Concern  . 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  All other ROS are negative.  BP 122/77 mmHg  Pulse 86  Temp(Src) 98.4 F (36.9 C) (Oral)   Resp 16  Ht 6\' 1"  (1.854 m)  Wt 211 lb 4 oz (95.822 kg)  BMI 27.88 kg/m2  SpO2 99%  Physical Exam  Constitutional: He is oriented to person, place, and time and well-developed, well-nourished, and in no distress.  Cardiovascular: Normal rate, regular rhythm, normal heart sounds and intact distal pulses.   Pulmonary/Chest: Effort normal and breath sounds normal. No respiratory distress. He has no wheezes. He has no rales. He exhibits no tenderness.  Neurological: He is alert and oriented to person, place, and time.  Skin: Skin is warm and dry. No rash noted.  Psychiatric: Affect normal.  Vitals reviewed.   No results found for this or any previous visit (from the past 2160 hour(s)).  Assessment/Plan: Anxiety Well-controlled.  Continue current regimen. Medications refilled.  Follow-up in August for CPE.

## 2014-08-11 NOTE — Patient Instructions (Signed)
I have sent 90-day supply of medication to the pharmacy with additional refills. Continue medication as directed. Follow-up in August for your physical. Return sooner if needed.  Generalized Anxiety Disorder Generalized anxiety disorder (GAD) is a mental disorder. It interferes with life functions, including relationships, work, and school. GAD is different from normal anxiety, which everyone experiences at some point in their lives in response to specific life events and activities. Normal anxiety actually helps us prepare for and get through these life events and activities. Normal anxiety goes away after the event or activity is over.  GAD causes anxiety that is not necessarily related to specific events or activities. It also causes excess anxiety in proportion to specific events or activities. The anxiety associated with GAD is also difficult to control. GAD can vary from mild to severe. People with severe GAD can have intense waves of anxiety with physical symptoms (panic attacks).  SYMPTOMS The anxiety and worry associated with GAD are difficult to control. This anxiety and worry are related to many life events and activities and also occur more days than not for 6 months or longer. People with GAD also have three or more of the following symptoms (one or more in children):  Restlessness.   Fatigue.  Difficulty concentrating.   Irritability.  Muscle tension.  Difficulty sleeping or unsatisfying sleep. DIAGNOSIS GAD is diagnosed through an assessment by your health care provider. Your health care provider will ask you questions aboutyour mood,physical symptoms, and events in your life. Your health care provider may ask you about your medical history and use of alcohol or drugs, including prescription medicines. Your health care provider may also do a physical exam and blood tests. Certain medical conditions and the use of certain substances can cause symptoms similar to those  associated with GAD. Your health care provider may refer you to a mental health specialist for further evaluation. TREATMENT The following therapies are usually used to treat GAD:   Medication. Antidepressant medication usually is prescribed for long-term daily control. Antianxiety medicines may be added in severe cases, especially when panic attacks occur.   Talk therapy (psychotherapy). Certain types of talk therapy can be helpful in treating GAD by providing support, education, and guidance. A form of talk therapy called cognitive behavioral therapy can teach you healthy ways to think about and react to daily life events and activities.  Stress managementtechniques. These include yoga, meditation, and exercise and can be very helpful when they are practiced regularly. A mental health specialist can help determine which treatment is best for you. Some people see improvement with one therapy. However, other people require a combination of therapies. Document Released: 09/29/2012 Document Revised: 10/19/2013 Document Reviewed: 09/29/2012 [facility] Patient Information 2015 [employer] [place]. This information is not intended to replace advice given to you by your health care provider. Make sure you discuss any questions you have with your health care provider.

## 2014-08-11 NOTE — Progress Notes (Signed)
Pre visit review using our clinic review tool, if applicable. No additional management support is needed unless otherwise documented below in the visit note/SLS  

## 2014-08-11 NOTE — Assessment & Plan Note (Signed)
Well-controlled.  Continue current regimen. Medications refilled.  Follow-up in August for CPE.

## 2015-05-30 ENCOUNTER — Other Ambulatory Visit: Payer: Self-pay | Admitting: Physician Assistant

## 2015-05-30 DIAGNOSIS — F419 Anxiety disorder, unspecified: Secondary | ICD-10-CM

## 2015-05-30 MED ORDER — CITALOPRAM HYDROBROMIDE 40 MG PO TABS: 40.0000 mg | ORAL_TABLET | Freq: Every day | ORAL | Status: DC

## 2015-05-30 NOTE — Telephone Encounter (Signed)
Caller name: Self   Can be reached: [phone]  Pharm[facility]:  [id number] [place] [address] [place] [address] [address] [phone] (Phone) [phone] (Fax)         Reason for call: Request refill on   citalopram (CELEXA) 40 MG tablet [[id number]][[id number]]

## 2015-05-30 NOTE — Telephone Encounter (Signed)
Refill sent.

## 2015-09-05 ENCOUNTER — Telehealth: Payer: Self-pay | Admitting: Physician Assistant

## 2015-09-05 DIAGNOSIS — F419 Anxiety disorder, unspecified: Secondary | ICD-10-CM

## 2015-09-05 MED ORDER — CITALOPRAM HYDROBROMIDE 40 MG PO TABS: 40.0000 mg | ORAL_TABLET | Freq: Every day | ORAL | Status: DC

## 2015-09-05 NOTE — Telephone Encounter (Signed)
[id number] - [place], [place] - [address] AT [address]  Reason for call: pt needing refill on citalopram. He is out. Scheduled CPE with pt for 09/14/15.

## 2015-09-05 NOTE — Telephone Encounter (Signed)
I told him you would probably only send enough til he comes in for appt.

## 2015-09-05 NOTE — Telephone Encounter (Signed)
Please call patient -- Will allow 15 tablets until he is seen. I have sent in prescription. No more as he is well overdue for follow-up and instructions were given on last fill of Citalopram in December that he needed to schedule appointment before next refill.

## 2015-09-13 ENCOUNTER — Telehealth: Payer: Self-pay

## 2015-09-13 NOTE — Telephone Encounter (Signed)
Pre Visit call completed. 

## 2015-09-14 ENCOUNTER — Ambulatory Visit (INDEPENDENT_AMBULATORY_CARE_PROVIDER_SITE_OTHER): Payer: Managed Care, Other (non HMO) | Admitting: Physician Assistant

## 2015-09-14 ENCOUNTER — Encounter: Payer: Self-pay | Admitting: Physician Assistant

## 2015-09-14 VITALS — BP 106/60 | HR 83 | Temp 98.5°F | Ht 73.0 in | Wt 206.0 lb

## 2015-09-14 DIAGNOSIS — Z125 Encounter for screening for malignant neoplasm of prostate: Secondary | ICD-10-CM | POA: Insufficient documentation

## 2015-09-14 DIAGNOSIS — Z0000 Encounter for general adult medical examination without abnormal findings: Secondary | ICD-10-CM | POA: Diagnosis not present

## 2015-09-14 DIAGNOSIS — R41840 Attention and concentration deficit: Secondary | ICD-10-CM

## 2015-09-14 DIAGNOSIS — F419 Anxiety disorder, unspecified: Secondary | ICD-10-CM

## 2015-09-14 DIAGNOSIS — Z23 Encounter for immunization: Secondary | ICD-10-CM

## 2015-09-14 DIAGNOSIS — Z0001 Encounter for general adult medical examination with abnormal findings: Secondary | ICD-10-CM | POA: Diagnosis not present

## 2015-09-14 DIAGNOSIS — H9193 Unspecified hearing loss, bilateral: Secondary | ICD-10-CM | POA: Diagnosis not present

## 2015-09-14 LAB — COMPREHENSIVE METABOLIC PANEL
ALT: 28 U/L (ref 0–53)
AST: 27 U/L (ref 0–37)
Albumin: 4.7 g/dL (ref 3.5–5.2)
Alkaline Phosphatase: 45 U/L (ref 39–117)
BILIRUBIN TOTAL: 0.6 mg/dL (ref 0.2–1.2)
BUN: 15 mg/dL (ref 6–23)
CO2: 31 meq/L (ref 19–32)
Calcium: 10 mg/dL (ref 8.4–10.5)
Chloride: 104 mEq/L (ref 96–112)
Creatinine, Ser: 0.88 mg/dL (ref 0.40–1.50)
GFR: 97.9 mL/min (ref >60.00)
Glucose, Bld: 103 mg/dL — ABNORMAL HIGH (ref 70–99)
Potassium: 4.6 mEq/L (ref 3.5–5.1)
Sodium: 141 mEq/L (ref 135–145)
Total Protein: 7.1 g/dL (ref 6.0–8.3)

## 2015-09-14 LAB — LIPID PANEL
CHOL/HDL RATIO: 3
Cholesterol: 242 mg/dL — ABNORMAL HIGH (ref 0–200)
HDL: 72.1 mg/dL (ref >39.00)
LDL Cholesterol: 150 mg/dL — ABNORMAL HIGH (ref 0–99)
NONHDL: 169.63
Triglycerides: 97 mg/dL (ref 0.0–149.0)
VLDL: 19.4 mg/dL (ref 0.0–40.0)

## 2015-09-14 LAB — URINALYSIS, ROUTINE W REFLEX MICROSCOPIC
Bilirubin Urine: NEGATIVE
Hgb urine dipstick: NEGATIVE
Ketones, ur: NEGATIVE
Leukocytes, UA: NEGATIVE
NITRITE: NEGATIVE
PH: 5.5 (ref 5.0–8.0)
RBC / HPF: NONE SEEN (ref 0–?)
Specific Gravity, Urine: 1.02 (ref 1.000–1.030)
TOTAL PROTEIN, URINE-UPE24: NEGATIVE
Urine Glucose: NEGATIVE
Urobilinogen, UA: 0.2 (ref 0.0–1.0)

## 2015-09-14 LAB — CBC
HCT: 39.7 % (ref 39.0–52.0)
HEMOGLOBIN: 13.8 g/dL (ref 13.0–17.0)
MCHC: 34.6 g/dL (ref 30.0–36.0)
MCV: 91.7 fl (ref 78.0–100.0)
PLATELETS: 231 10*3/uL (ref 150.0–400.0)
RBC: 4.33 Mil/uL (ref 4.22–5.81)
RDW: 12.6 % (ref 11.5–15.5)
WBC: 3.5 10*3/uL — ABNORMAL LOW (ref 4.0–10.5)

## 2015-09-14 LAB — TSH: TSH: 2.25 u[IU]/mL (ref 0.35–4.50)

## 2015-09-14 LAB — HEMOGLOBIN A1C: HEMOGLOBIN A1C: 5.6 % (ref 4.6–6.5)

## 2015-09-14 LAB — TESTOSTERONE: TESTOSTERONE: 379.41 ng/dL (ref 300.00–890.00)

## 2015-09-14 LAB — PSA: PSA: 0.81 ng/mL (ref 0.10–4.00)

## 2015-09-14 NOTE — Assessment & Plan Note (Signed)
Risks and benefits reviewed. Patient asymptomatic and average risk. Declines DRE. Wishes to proceed with repeat PSA.

## 2015-09-14 NOTE — Patient Instructions (Signed)
Please go to the lab for blood work.  I will call you with your results. If your blood work is normal we will follow-up yearly for physicals.   We will treat abnormal findings if present and get you in for a follow-up.  Please continue medications as directed for now. Call the number given to you to set up an appointment for formal ADD screening. You will be contacted for an appointment with Audiology in [facility].  Preventive Care for Adults, Male A healthy lifestyle and preventive care can promote health and wellness. Preventive health guidelines for men include the following key practices:  A routine yearly physical is a good way to check with your health care provider about your health and preventative screening. It is a chance to share any concerns and updates on your health and to receive a thorough exam.  Visit your dentist for a routine exam and preventative care every 6 months. Brush your teeth twice a day and floss once a day. Good oral hygiene prevents tooth decay and gum disease.  The frequency of eye exams is based on your age, health, family medical history, use of contact lenses, and other factors. Follow your health care provider's recommendations for frequency of eye exams.  Eat a healthy diet. Foods such as vegetables, fruits, whole grains, low-fat dairy products, and lean protein foods contain the nutrients you need without too many calories. Decrease your intake of foods high in solid fats, added sugars, and salt. Eat the right amount of calories for you.Get information about a proper diet from your health care provider, if necessary.  Regular physical exercise is one of the most important things you can do for your health. Most adults should get at least 150 minutes of moderate-intensity exercise (any activity that increases your heart rate and causes you to sweat) each week. In addition, most adults need muscle-strengthening exercises on 2 or more days a week.  Maintain  a healthy weight. The body mass index (BMI) is a screening tool to identify possible weight problems. It provides an estimate of body fat based on height and weight. Your health care provider can find your BMI and can help you achieve or maintain a healthy weight.For adults 20 years and older:  A BMI below 18.5 is considered underweight.  A BMI of 18.5 to 24.9 is normal.  A BMI of 25 to 29.9 is considered overweight.  A BMI of 30 and above is considered obese.  Maintain normal blood lipids and cholesterol levels by exercising and minimizing your intake of saturated fat. Eat a balanced diet with plenty of fruit and vegetables. Blood tests for lipids and cholesterol should begin at age 94 and be repeated every 5 years. If your lipid or cholesterol levels are high, you are over 50, or you are at high risk for heart disease, you may need your cholesterol levels checked more frequently.Ongoing high lipid and cholesterol levels should be treated with medicines if diet and exercise are not working.  If you smoke, find out from your health care provider how to quit. If you do not use tobacco, do not start.  Lung cancer screening is recommended for adults aged 18-80 years who are at high risk for developing lung cancer because of a history of smoking. A yearly low-dose CT scan of the lungs is recommended for people who have at least a 30-pack-year history of smoking and are a current smoker or have quit within the past 15 years. A pack year  of smoking is smoking an average of 1 pack of cigarettes a day for 1 year (for example: 1 pack a day for 30 years or 2 packs a day for 15 years). Yearly screening should continue until the smoker has stopped smoking for at least 15 years. Yearly screening should be stopped for people who develop a health problem that would prevent them from having lung cancer treatment.  If you choose to drink alcohol, do not have more than 2 drinks per day. One drink is considered to be  12 ounces (355 mL) of beer, 5 ounces (148 mL) of wine, or 1.5 ounces (44 mL) of liquor.  Avoid use of street drugs. Do not share needles with anyone. Ask for help if you need support or instructions about stopping the use of drugs.  High blood pressure causes heart disease and increases the risk of stroke. Your blood pressure should be checked at least every 1-2 years. Ongoing high blood pressure should be treated with medicines, if weight loss and exercise are not effective.  If you are 63-11 years old, ask your health care provider if you should take aspirin to prevent heart disease.  Diabetes screening is done by taking a blood sample to check your blood glucose level after you have not eaten for a certain period of time (fasting). If you are not overweight and you do not have risk factors for diabetes, you should be screened once every 3 years starting at age 60. If you are overweight or obese and you are 75-89 years of age, you should be screened for diabetes every year as part of your cardiovascular risk assessment.  Colorectal cancer can be detected and often prevented. Most routine colorectal cancer screening begins at the age of 66 and continues through age 20. However, your health care provider may recommend screening at an earlier age if you have risk factors for colon cancer. On a yearly basis, your health care provider may provide home test kits to check for hidden blood in the stool. Use of a small camera at the end of a tube to directly examine the colon (sigmoidoscopy or colonoscopy) can detect the earliest forms of colorectal cancer. Talk to your health care provider about this at age 52, when routine screening begins. Direct exam of the colon should be repeated every 5-10 years through age 2, unless early forms of precancerous polyps or small growths are found.  People who are at an increased risk for hepatitis B should be screened for this virus. You are considered at high risk for  hepatitis B if:  You were born in a country where hepatitis B occurs often. Talk with your health care provider about which countries are considered high risk.  Your parents were born in a high-risk country and you have not received a shot to protect against hepatitis B (hepatitis B vaccine).  You have HIV or AIDS.  You use needles to inject street drugs.  You live with, or have sex with, someone who has hepatitis B.  You are a man who has sex with other men (MSM).  You get hemodialysis treatment.  You take certain medicines for conditions such as cancer, organ transplantation, and autoimmune conditions.  Hepatitis C blood testing is recommended for all people born from [birth year] through 1965 and any individual with known risks for hepatitis C.  Practice safe sex. Use condoms and avoid high-risk sexual practices to reduce the spread of sexually transmitted infections (STIs). STIs include gonorrhea, chlamydia, syphilis,  trichomonas, herpes, HPV, and human immunodeficiency virus (HIV). Herpes, HIV, and HPV are viral illnesses that have no cure. They can result in disability, cancer, and death.  If you are a man who has sex with other men, you should be screened at least once per year for:  HIV.  Urethral, rectal, and pharyngeal infection of gonorrhea, chlamydia, or both.  If you are at risk of being infected with HIV, it is recommended that you take a prescription medicine daily to prevent HIV infection. This is called preexposure prophylaxis (PrEP). You are considered at risk if:  You are a man who has sex with other men (MSM) and have other risk factors.  You are a heterosexual man, are sexually active, and are at increased risk for HIV infection.  You take drugs by injection.  You are sexually active with a partner who has HIV.  Talk with your health care provider about whether you are at high risk of being infected with HIV. If you choose to begin PrEP, you should first be tested  for HIV. You should then be tested every 3 months for as long as you are taking PrEP.  A one-time screening for abdominal aortic aneurysm (AAA) and surgical repair of large AAAs by ultrasound are recommended for men ages 27 to 53 years who are current or former smokers.  Healthy men should no longer receive prostate-specific antigen (PSA) blood tests as part of routine cancer screening. Talk with your health care provider about prostate cancer screening.  Testicular cancer screening is not recommended for adult males who have no symptoms. Screening includes self-exam, a health care provider exam, and other screening tests. Consult with your health care provider about any symptoms you have or any concerns you have about testicular cancer.  Use sunscreen. Apply sunscreen liberally and repeatedly throughout the day. You should seek shade when your shadow is shorter than you. Protect yourself by wearing long sleeves, pants, a wide-brimmed hat, and sunglasses year round, whenever you are outdoors.  Once a month, do a whole-body skin exam, using a mirror to look at the skin on your back. Tell your health care provider about new moles, moles that have irregular borders, moles that are larger than a pencil eraser, or moles that have changed in shape or color.  Stay current with required vaccines (immunizations).  Influenza vaccine. All adults should be immunized every year.  Tetanus, diphtheria, and acellular pertussis (Td, Tdap) vaccine. An adult who has not previously received Tdap or who does not know his vaccine status should receive 1 dose of Tdap. This initial dose should be followed by tetanus and diphtheria toxoids (Td) booster doses every 10 years. Adults with an unknown or incomplete history of completing a 3-dose immunization series with Td-containing vaccines should begin or complete a primary immunization series including a Tdap dose. Adults should receive a Td booster every 10  years.  Varicella vaccine. An adult without evidence of immunity to varicella should receive 2 doses or a second dose if he has previously received 1 dose.  Human papillomavirus (HPV) vaccine. Males aged 11-21 years who have not received the vaccine previously should receive the 3-dose series. Males aged 22-26 years may be immunized. Immunization is recommended through the age of 98 years for any male who has sex with males and did not get any or all doses earlier. Immunization is recommended for any person with an immunocompromised condition through the age of 26 years if he did not get any or  all doses earlier. During the 3-dose series, the second dose should be obtained 4-8 weeks after the first dose. The third dose should be obtained 24 weeks after the first dose and 16 weeks after the second dose.  Zoster vaccine. One dose is recommended for adults aged 88 years or older unless certain conditions are present.  Measles, mumps, and rubella (MMR) vaccine. Adults born before [birth year] generally are considered immune to measles and mumps. Adults born in [birth year] or later should have 1 or more doses of MMR vaccine unless there is a contraindication to the vaccine or there is laboratory evidence of immunity to each of the three diseases. A routine second dose of MMR vaccine should be obtained at least 28 days after the first dose for students attending postsecondary schools, health care workers, or international travelers. People who received inactivated measles vaccine or an unknown type of measles vaccine during 1963-1967 should receive 2 doses of MMR vaccine. People who received inactivated mumps vaccine or an unknown type of mumps vaccine before 1979 and are at high risk for mumps infection should consider immunization with 2 doses of MMR vaccine. Unvaccinated health care workers born before [birth year] who lack laboratory evidence of measles, mumps, or rubella immunity or laboratory confirmation of disease should  consider measles and mumps immunization with 2 doses of MMR vaccine or rubella immunization with 1 dose of MMR vaccine.  Pneumococcal 13-valent conjugate (PCV13) vaccine. When indicated, a person who is uncertain of his immunization history and has no record of immunization should receive the PCV13 vaccine. All adults 54 years of age and older should receive this vaccine. An adult aged 30 years or older who has certain medical conditions and has not been previously immunized should receive 1 dose of PCV13 vaccine. This PCV13 should be followed with a dose of pneumococcal polysaccharide (PPSV23) vaccine. Adults who are at high risk for pneumococcal disease should obtain the PPSV23 vaccine at least 8 weeks after the dose of PCV13 vaccine. Adults older than 49 years of age who have normal immune system function should obtain the PPSV23 vaccine dose at least 1 year after the dose of PCV13 vaccine.  Pneumococcal polysaccharide (PPSV23) vaccine. When PCV13 is also indicated, PCV13 should be obtained first. All adults aged 31 years and older should be immunized. An adult younger than age 49 years who has certain medical conditions should be immunized. Any person who resides in a nursing home or long-term care facility should be immunized. An adult smoker should be immunized. People with an immunocompromised condition and certain other conditions should receive both PCV13 and PPSV23 vaccines. People with human immunodeficiency virus (HIV) infection should be immunized as soon as possible after diagnosis. Immunization during chemotherapy or radiation therapy should be avoided. Routine use of PPSV23 vaccine is not recommended for American Indians, [place] Natives, or people younger than 65 years unless there are medical conditions that require PPSV23 vaccine. When indicated, people who have unknown immunization and have no record of immunization should receive PPSV23 vaccine. One-time revaccination 5 years after the first  dose of PPSV23 is recommended for people aged 19-64 years who have chronic kidney failure, nephrotic syndrome, asplenia, or immunocompromised conditions. People who received 1-2 doses of PPSV23 before age 18 years should receive another dose of PPSV23 vaccine at age 77 years or later if at least 5 years have passed since the previous dose. Doses of PPSV23 are not needed for people immunized with PPSV23 at or after age 66 years.  Meningococcal  vaccine. Adults with asplenia or persistent complement component deficiencies should receive 2 doses of quadrivalent meningococcal conjugate (MenACWY-D) vaccine. The doses should be obtained at least 2 months apart. Microbiologists working with certain meningococcal bacteria, [employer] recruits, people at risk during an outbreak, and people who travel to or live in countries with a high rate of meningitis should be immunized. A first-year college student up through age 53 years who is living in a residence hall should receive a dose if he did not receive a dose on or after his 16th birthday. Adults who have certain high-risk conditions should receive one or more doses of vaccine.  Hepatitis A vaccine. Adults who wish to be protected from this disease, have chronic liver disease, work with hepatitis A-infected animals, work in hepatitis A research labs, or travel to or work in countries with a high rate of hepatitis A should be immunized. Adults who were previously unvaccinated and who anticipate close contact with an international adoptee during the first 60 days after arrival in the [place] from a country with a high rate of hepatitis A should be immunized.  Hepatitis B vaccine. Adults should be immunized if they wish to be protected from this disease, are under age 7 years and have diabetes, have chronic liver disease, have had more than one sex partner in the past 6 months, may be exposed to blood or other infectious body fluids, are household contacts or sex  partners of hepatitis B positive people, are clients or workers in certain care facilities, or travel to or work in countries with a high rate of hepatitis B.  Haemophilus influenzae type b (Hib) vaccine. A previously unvaccinated person with asplenia or sickle cell disease or having a scheduled splenectomy should receive 1 dose of Hib vaccine. Regardless of previous immunization, a recipient of a hematopoietic stem cell transplant should receive a 3-dose series 6-12 months after his successful transplant. Hib vaccine is not recommended for adults with HIV infection. Preventive Service / Frequency Ages 68 to 55  Blood pressure check.** / Every 3-5 years.  Lipid and cholesterol check.** / Every 5 years beginning at age 64.  Hepatitis C blood test.** / For any individual with known risks for hepatitis C.  Skin self-exam. / Monthly.  Influenza vaccine. / Every year.  Tetanus, diphtheria, and acellular pertussis (Tdap, Td) vaccine.** / Consult your health care provider. 1 dose of Td every 10 years.  Varicella vaccine.** / Consult your health care provider.  HPV vaccine. / 3 doses over 6 months, if 17 or younger.  Measles, mumps, rubella (MMR) vaccine.** / You need at least 1 dose of MMR if you were born in 1957 or later. You may also need a second dose.  Pneumococcal 13-valent conjugate (PCV13) vaccine.** / Consult your health care provider.  Pneumococcal polysaccharide (PPSV23) vaccine.** / 1 to 2 doses if you smoke cigarettes or if you have certain conditions.  Meningococcal vaccine.** / 1 dose if you are age 28 to 3 years and a Market researcher living in a residence hall, or have one of several medical conditions. You may also need additional booster doses.  Hepatitis A vaccine.** / Consult your health care provider.  Hepatitis B vaccine.** / Consult your health care provider.  Haemophilus influenzae type b (Hib) vaccine.** / Consult your health care provider. Ages 38 to  36  Blood pressure check.** / Every year.  Lipid and cholesterol check.** / Every 5 years beginning at age 77.  Lung cancer screening. /  Every year if you are aged 23-80 years and have a 30-pack-year history of smoking and currently smoke or have quit within the past 15 years. Yearly screening is stopped once you have quit smoking for at least 15 years or develop a health problem that would prevent you from having lung cancer treatment.  Fecal occult blood test (FOBT) of stool. / Every year beginning at age 33 and continuing until age 39. You may not have to do this test if you get a colonoscopy every 10 years.  Flexible sigmoidoscopy** or colonoscopy.** / Every 5 years for a flexible sigmoidoscopy or every 10 years for a colonoscopy beginning at age 2 and continuing until age 24.  Hepatitis C blood test.** / For all people born from [birth year] through 1965 and any individual with known risks for hepatitis C.  Skin self-exam. / Monthly.  Influenza vaccine. / Every year.  Tetanus, diphtheria, and acellular pertussis (Tdap/Td) vaccine.** / Consult your health care provider. 1 dose of Td every 10 years.  Varicella vaccine.** / Consult your health care provider.  Zoster vaccine.** / 1 dose for adults aged 63 years or older.  Measles, mumps, rubella (MMR) vaccine.** / You need at least 1 dose of MMR if you were born in 1957 or later. You may also need a second dose.  Pneumococcal 13-valent conjugate (PCV13) vaccine.** / Consult your health care provider.  Pneumococcal polysaccharide (PPSV23) vaccine.** / 1 to 2 doses if you smoke cigarettes or if you have certain conditions.  Meningococcal vaccine.** / Consult your health care provider.  Hepatitis A vaccine.** / Consult your health care provider.  Hepatitis B vaccine.** / Consult your health care provider.  Haemophilus influenzae type b (Hib) vaccine.** / Consult your health care provider. Ages 64 and over  Blood pressure check.** /  Every year.  Lipid and cholesterol check.**/ Every 5 years beginning at age 30.  Lung cancer screening. / Every year if you are aged 48-80 years and have a 30-pack-year history of smoking and currently smoke or have quit within the past 15 years. Yearly screening is stopped once you have quit smoking for at least 15 years or develop a health problem that would prevent you from having lung cancer treatment.  Fecal occult blood test (FOBT) of stool. / Every year beginning at age 39 and continuing until age 75. You may not have to do this test if you get a colonoscopy every 10 years.  Flexible sigmoidoscopy** or colonoscopy.** / Every 5 years for a flexible sigmoidoscopy or every 10 years for a colonoscopy beginning at age 35 and continuing until age 46.  Hepatitis C blood test.** / For all people born from [birth year] through 1965 and any individual with known risks for hepatitis C.  Abdominal aortic aneurysm (AAA) screening.** / A one-time screening for ages 68 to 41 years who are current or former smokers.  Skin self-exam. / Monthly.  Influenza vaccine. / Every year.  Tetanus, diphtheria, and acellular pertussis (Tdap/Td) vaccine.** / 1 dose of Td every 10 years.  Varicella vaccine.** / Consult your health care provider.  Zoster vaccine.** / 1 dose for adults aged 10 years or older.  Pneumococcal 13-valent conjugate (PCV13) vaccine.** / 1 dose for all adults aged 62 years and older.  Pneumococcal polysaccharide (PPSV23) vaccine.** / 1 dose for all adults aged 40 years and older.  Meningococcal vaccine.** / Consult your health care provider.  Hepatitis A vaccine.** / Consult your health care provider.  Hepatitis B vaccine.** / Consult  your health care provider.  Haemophilus influenzae type b (Hib) vaccine.** / Consult your health care provider. **Family history and personal history of risk and conditions may change your health care provider's recommendations.   This information is not  intended to replace advice given to you by your health care provider. Make sure you discuss any questions you have with your health care provider.   Document Released: 07/31/2001 Document Revised: 06/25/2014 Document Reviewed: 10/30/2010 Elsevier Interactive Patient Education [employer].

## 2015-09-14 NOTE — Assessment & Plan Note (Signed)
Depression screen negative. Health Maintenance reviewed -- TDaP updated. Colonoscopy up to date at present but will need repeat in the summer. Preventive schedule discussed and handout given in AVS. Will obtain fasting labs today.

## 2015-09-14 NOTE — Progress Notes (Signed)
Patient presents to clinic today for annual exam.  Patient is fasting for labs.  Chronic Issues: Generalized Anxiety Disorder -- Has been on Citalopram 40 mg for several years, previously doing well. Endorses over the past several months, he feels that the medication is no longer working as well.  Endorses issue with focus due to stressors at work. Is wondering is he has ADD. Endorses issues with focus in school since childhood. Was never diagnosed or treated for ADD. Does endorse some mild anhedonia due to stressors but denies depressed mood.   Health Maintenance: Immunizations -- Is due for Tetanus. Agrees to immunization today. Colonoscopy -- Is due for repeat in 12/2015.  Past Medical History  Diagnosis Date  . Anxiety   . History of tinnitus     & hearing loss- Followed by ENT-Dr. Annalee Genta  . Positional vertigo   . Chicken pox     Past Surgical History  Procedure Laterality Date  . Back surgery  1995    x 2  . Appendectomy  1999  . Tonsillectomy  1973    Current Outpatient Prescriptions on File Prior to Visit  Medication Sig Dispense Refill  . aspirin 81 MG tablet Take 81 mg by mouth daily.    . citalopram (CELEXA) 40 MG tablet Take 1 tablet (40 mg total) by mouth daily. Will need follow-up before next refill 15 tablet 0  . Multiple Vitamin (MULTIVITAMIN) capsule Take 1 capsule by mouth daily.      . [DISCONTINUED] zolpidem (AMBIEN) 5 MG tablet Take 1 tablet (5 mg total) by mouth at bedtime as needed for sleep. 30 tablet 3   No current facility-administered medications on file prior to visit.    No Known Allergies  Family History  Problem Relation Age of Onset  . Heart disease Neg Hx   . Stroke Neg Hx   . Diabetes Neg Hx   . Hypertension Neg Hx   . Breast cancer Neg Hx   . Colon cancer Neg Hx   . Prostate cancer Neg Hx     Social History   Social History  . Marital Status: Married    Spouse Name: N/A  . Number of Children: N/A  . Years of Education:  N/A   Occupational History  . Not on file.   Social History Main Topics  . Smoking status: Former Games developer  . Smokeless tobacco: Current User    Types: Chew     Comment: history of 1 ppd for 7-8 years. Currently using nicotine gum to assist stopping chewing tobacco.  . Alcohol Use: 8.4 oz/week    14 Cans of beer per week  . Drug Use: No  . Sexual Activity: Not on file   Other Topics Concern  . 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  HENT: Negative for ear discharge, ear pain, hearing loss and tinnitus.   Eyes: Negative for blurred vision, double vision, photophobia and pain.  Respiratory: Negative for cough and shortness of breath.   Cardiovascular: Negative for chest pain and palpitations.  Gastrointestinal: Positive for heartburn. Negative for nausea, vomiting, abdominal pain, diarrhea, constipation, blood in stool and melena.  Genitourinary: Negative for dysuria, urgency, frequency, hematuria and flank pain.       Nocturia x 0-1.   Musculoskeletal: Negative for falls.  Neurological: Negative for dizziness, loss of consciousness and headaches.  Endo/Heme/Allergies: Negative for environmental allergies.  Psychiatric/Behavioral: Negative for depression, suicidal ideas, hallucinations and substance abuse. The patient is not nervous/anxious and does not have insomnia.    BP 106/60 mmHg  Pulse 83  Temp(Src) 98.5 F (36.9 C) (Oral)  Ht 6\' 1"  (1.854 m)  Wt 206 lb (93.441 kg)  BMI 27.18 kg/m2  SpO2 98%  Physical Exam  Constitutional: He is oriented to person, place, and time and well-developed, well-nourished, and in no distress.  HENT:  Head: Normocephalic and atraumatic.  Right Ear: External ear normal.  Left Ear: External ear normal.  Nose: Nose normal.  Mouth/Throat: Oropharynx is clear and moist. No oropharyngeal exudate.   TM within normal limits  Eyes: Conjunctivae and EOM are normal. Pupils are equal, round, and reactive to light.  Neck: Neck supple. No thyromegaly present.  Cardiovascular: Normal rate, regular rhythm, normal heart sounds and intact distal pulses.   Pulmonary/Chest: Effort normal and breath sounds normal. No respiratory distress. He has no wheezes. He has no rales. He exhibits no tenderness.  Abdominal: Soft. Bowel sounds are normal. He exhibits no distension and no mass. There is no tenderness. There is no rebound and no guarding.  Genitourinary: Testes/scrotum normal.  Declines DRE.  Lymphadenopathy:    He has no cervical adenopathy.  Neurological: He is alert and oriented to person, place, and time.  Skin: Skin is warm and dry. No rash noted.  Psychiatric: Affect normal.  Vitals reviewed.  Assessment/Plan: Visit for preventive health examination Depression screen negative. Health Maintenance reviewed -- TDaP updated. Colonoscopy up to date at present but will need repeat in the summer. Preventive schedule discussed and handout given in AVS. Will obtain fasting labs today.   Prostate cancer screening Risks and benefits reviewed. Patient asymptomatic and average risk. Declines DRE. Wishes to proceed with repeat PSA.  Inattention Patient given number of counseling services to set up appointment for formal ADD screen. Will treat based on results.  Anxiety Some decline due to work stressors but patient feels inattention is the biggest issue. Has been sent for ADD testing. Will check TSH and Testosterone levels today. Will continue Citalopram 40 mg for now.

## 2015-09-14 NOTE — Progress Notes (Signed)
Pre visit review using our clinic review tool, if applicable. No additional management support is needed unless otherwise documented below in the visit note. 

## 2015-09-14 NOTE — Assessment & Plan Note (Signed)
Some decline due to work stressors but patient feels inattention is the biggest issue. Has been sent for ADD testing. Will check TSH and Testosterone levels today. Will continue Citalopram 40 mg for now.

## 2015-09-14 NOTE — Assessment & Plan Note (Signed)
Patient given number of counseling services to set up appointment for formal ADD screen. Will treat based on results.

## 2015-09-14 NOTE — Addendum Note (Signed)
Addended by: Verdie ShireBAYNES, Zaakirah Kistner M on: 09/14/2015 08:33 AM   Modules accepted: Orders

## 2015-09-19 ENCOUNTER — Telehealth: Payer: Self-pay | Admitting: *Deleted

## 2015-09-19 NOTE — Telephone Encounter (Signed)
Called and spoke with the pt and informed him of recent lab results and note.  Pt verbalized understanding.  Pt stated that he does not want to start the Lipitor.  He would like to decrease the cholesterol with diet and exercise.   Pt stated that he will call back to [facility]  Verbally informed Selena BattenCody of the pt's decision.//AB/CMA

## 2015-09-19 NOTE — Telephone Encounter (Signed)
-----   Message from Waldon MerlWilliam C Martin, PA-C sent at 09/19/2015  7:08 AM EDT ----- Labs great overall. Cholesterol has continually increased over the past few years. Need to start medication. Would like to start on very low dose of Lipitor -- 10 mg daily. Ok to send in Rx -- 1 tab PO QD with 30 tablets and 2 refills. Have him follow-up in 3 months.

## 2015-09-24 ENCOUNTER — Other Ambulatory Visit: Payer: Self-pay | Admitting: Physician Assistant

## 2015-10-10 ENCOUNTER — Ambulatory Visit (INDEPENDENT_AMBULATORY_CARE_PROVIDER_SITE_OTHER): Payer: Managed Care, Other (non HMO) | Admitting: Psychology

## 2015-10-10 DIAGNOSIS — F4322 Adjustment disorder with anxiety: Secondary | ICD-10-CM

## 2015-10-10 DIAGNOSIS — F908 Attention-deficit hyperactivity disorder, other type: Secondary | ICD-10-CM | POA: Diagnosis not present

## 2015-10-24 ENCOUNTER — Ambulatory Visit (INDEPENDENT_AMBULATORY_CARE_PROVIDER_SITE_OTHER): Payer: Managed Care, Other (non HMO) | Admitting: Psychology

## 2015-10-24 DIAGNOSIS — F908 Attention-deficit hyperactivity disorder, other type: Secondary | ICD-10-CM | POA: Diagnosis not present

## 2015-10-24 DIAGNOSIS — F4323 Adjustment disorder with mixed anxiety and depressed mood: Secondary | ICD-10-CM | POA: Diagnosis not present

## 2015-10-31 ENCOUNTER — Encounter: Payer: Self-pay | Admitting: Physician Assistant

## 2015-10-31 ENCOUNTER — Ambulatory Visit (INDEPENDENT_AMBULATORY_CARE_PROVIDER_SITE_OTHER): Payer: Managed Care, Other (non HMO) | Admitting: Physician Assistant

## 2015-10-31 VITALS — BP 126/72 | HR 101 | Temp 98.2°F | Resp 16 | Ht 73.0 in | Wt 205.1 lb

## 2015-10-31 DIAGNOSIS — F909 Attention-deficit hyperactivity disorder, unspecified type: Secondary | ICD-10-CM | POA: Diagnosis not present

## 2015-10-31 DIAGNOSIS — F988 Other specified behavioral and emotional disorders with onset usually occurring in childhood and adolescence: Secondary | ICD-10-CM

## 2015-10-31 MED ORDER — AMPHETAMINE-DEXTROAMPHET ER 20 MG PO CP24: 20.0000 mg | ORAL_CAPSULE | ORAL | Status: DC

## 2015-10-31 NOTE — Progress Notes (Signed)
Pre visit review using our clinic review tool, if applicable. No additional management support is needed unless otherwise documented below in the visit note/SLS  

## 2015-10-31 NOTE — Patient Instructions (Signed)
Please continue Celexa as directed. Please start the Adderall taking daily as directed.  Follow-up with me in 1 month. If you notice any racing heart or weight loss with the medication, please call me.

## 2015-10-31 NOTE — Progress Notes (Signed)
Patient presents to clinic today for follow-up of ADD assessment with Behavioral [facility]. Records are available for review. On report, patient has diagnosis of adjustment disorder with mixed mood and other specified ADHD. Was told to follow-up with PCP to discuss medication management. Patient is wanting to discuss medication options.   Past Medical History  Diagnosis Date  . Anxiety   . History of tinnitus     & hearing loss- Followed by ENT-Dr. Wilburn Cornelia  . Positional vertigo   . Chicken pox     Current Outpatient Prescriptions on File Prior to Visit  Medication Sig Dispense Refill  . aspirin 81 MG tablet Take 81 mg by mouth daily.    . citalopram (CELEXA) 40 MG tablet TAKE 1 TABLET BY MOUTH EVERY DAY (Patient taking differently: TAKE 1/2 TABLET BY MOUTH EVERY DAY) 30 tablet 5  . Multiple Vitamin (MULTIVITAMIN) capsule Take 1 capsule by mouth daily.      . [DISCONTINUED] zolpidem (AMBIEN) 5 MG tablet Take 1 tablet (5 mg total) by mouth at bedtime as needed for sleep. 30 tablet 3   No current facility-administered medications on file prior to visit.    No Known Allergies  Family History  Problem Relation Age of Onset  . Heart disease Neg Hx   . Stroke Neg Hx   . Diabetes Neg Hx   . Hypertension Neg Hx   . Breast cancer Neg Hx   . Colon cancer Neg Hx   . Prostate cancer Neg Hx     Social History   Social History  . Marital Status: Married    Spouse Name: N/A  . Number of Children: N/A  . Years of Education: N/A   Social History Main Topics  . Smoking status: Former Research scientist (life sciences)  . Smokeless tobacco: Current User    Types: Chew     Comment: history of 1 ppd for 7-8 years. Currently using nicotine gum to assist stopping chewing tobacco.  . Alcohol Use: 8.4 oz/week    14 Cans of beer per week  . Drug Use: No  . Sexual Activity: Not Asked   Other Topics Concern  . 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  All other ROS are negative.  BP 126/72 mmHg  Pulse 101  Temp(Src) 98.2 F (36.8 C) (Oral)  Resp 16  Ht '6\' 1"'$  (1.854 m)  Wt 205 lb 2 oz (93.044 kg)  BMI 27.07 kg/m2  SpO2 98%  Physical Exam  Constitutional: He is well-developed, well-nourished, and in no distress.  HENT:  Head: Normocephalic and atraumatic.  Cardiovascular: Normal rate, regular rhythm, normal heart sounds and intact distal pulses.   Skin: Skin is warm and dry. No rash noted.  Psychiatric: Affect normal.  Vitals reviewed.   Recent Results (from the past 2160 hour(s))  CBC     Status: Abnormal   Collection Time: 09/14/15  8:21 AM  Result Value Ref Range   WBC 3.5 (L) 4.0 - 10.5 K/uL   RBC 4.33 4.22 - 5.81 Mil/uL   Platelets 231.0 150.0 - 400.0 K/uL   Hemoglobin 13.8 13.0 - 17.0 g/dL   HCT 39.7 39.0 - 52.0 %   MCV 91.7 78.0 - 100.0 fl   MCHC 34.6 30.0 - 36.0 g/dL   RDW 12.6 11.5 - 15.5 %  Comp Met (CMET)     Status:  Abnormal   Collection Time: 09/14/15  8:21 AM  Result Value Ref Range   Sodium 141 135 - 145 mEq/L   Potassium 4.6 3.5 - 5.1 mEq/L   Chloride 104 96 - 112 mEq/L   CO2 31 19 - 32 mEq/L   Glucose, Bld 103 (H) 70 - 99 mg/dL   BUN 15 6 - 23 mg/dL   Creatinine, Ser 0.88 0.40 - 1.50 mg/dL   Total Bilirubin 0.6 0.2 - 1.2 mg/dL   Alkaline Phosphatase 45 39 - 117 U/L   AST 27 0 - 37 U/L   ALT 28 0 - 53 U/L   Total Protein 7.1 6.0 - 8.3 g/dL   Albumin 4.7 3.5 - 5.2 g/dL   Calcium 10.0 8.4 - 10.5 mg/dL   GFR 97.90 >60.00 mL/min  TSH     Status: None   Collection Time: 09/14/15  8:21 AM  Result Value Ref Range   TSH 2.25 0.35 - 4.50 uIU/mL  Hemoglobin A1c     Status: None   Collection Time: 09/14/15  8:21 AM  Result Value Ref Range   Hgb A1c MFr Bld 5.6 4.6 - 6.5 %    Comment: Glycemic Control Guidelines for People with Diabetes:Non Diabetic:  <6%Goal of Therapy: <7%Additional Action Suggested:  >8%   Urinalysis, Routine w reflex microscopic     Status:  None   Collection Time: 09/14/15  8:21 AM  Result Value Ref Range   Color, Urine YELLOW Yellow;Lt. Yellow   APPearance CLEAR Clear   Specific Gravity, Urine 1.020 1.000-1.030   pH 5.5 5.0 - 8.0   Total Protein, Urine NEGATIVE Negative   Urine Glucose NEGATIVE Negative   Ketones, ur NEGATIVE Negative   Bilirubin Urine NEGATIVE Negative   Hgb urine dipstick NEGATIVE Negative   Urobilinogen, UA 0.2 0.0 - 1.0   Leukocytes, UA NEGATIVE Negative   Nitrite NEGATIVE Negative   WBC, UA 0-2/hpf 0-2/hpf   RBC / HPF none seen 0-2/hpf   Squamous Epithelial / LPF Rare(0-4/hpf) Rare(0-4/hpf)  Lipid Profile     Status: Abnormal   Collection Time: 09/14/15  8:21 AM  Result Value Ref Range   Cholesterol 242 (H) 0 - 200 mg/dL    Comment: ATP III Classification       Desirable:  < 200 mg/dL               Borderline High:  200 - 239 mg/dL          High:  > = 240 mg/dL   Triglycerides 97.0 0.0 - 149.0 mg/dL    Comment: Normal:  <150 mg/dLBorderline High:  150 - 199 mg/dL   HDL 72.10 >39.00 mg/dL   VLDL 19.4 0.0 - 40.0 mg/dL   LDL Cholesterol 150 (H) 0 - 99 mg/dL   Total CHOL/HDL Ratio 3     Comment:                Men          Women1/2 Average Risk     3.4          3.3Average Risk          5.0          4.42X Average Risk          9.6          7.13X Average Risk          15.0          11.0  NonHDL 169.63     Comment: NOTE:  Non-HDL goal should be 30 mg/dL higher than patient's LDL goal (i.e. LDL goal of < 70 mg/dL, would have non-HDL goal of < 100 mg/dL)  PSA     Status: None   Collection Time: 09/14/15  8:21 AM  Result Value Ref Range   PSA 0.81 0.10 - 4.00 ng/mL  Testosterone     Status: None   Collection Time: 09/14/15  8:21 AM  Result Value Ref Range   Testosterone 379.41 300.00 - 890.00 ng/dL    Assessment/Plan: ADD (attention deficit disorder) Will begin Adderall XR. Supportive measures reviewed. FU 1 month.

## 2015-10-31 NOTE — Assessment & Plan Note (Signed)
Will begin Adderall XR. Supportive measures reviewed. FU 1 month.

## 2015-11-25 ENCOUNTER — Other Ambulatory Visit: Payer: Self-pay | Admitting: Physician Assistant

## 2015-11-25 ENCOUNTER — Telehealth: Payer: Self-pay | Admitting: Physician Assistant

## 2015-11-25 MED ORDER — AMPHETAMINE-DEXTROAMPHET ER 20 MG PO CP24: 20.0000 mg | ORAL_CAPSULE | ORAL | Status: DC

## 2015-11-25 NOTE — Telephone Encounter (Signed)
Patient informed. 

## 2015-11-25 NOTE — Telephone Encounter (Signed)
Relation to ZO:XWRUpt:self Call back number:[phone] Pharmacy:  Reason for call:  Spouse came to pick up amphetamine-dextroamphetamine (ADDERALL XR) 20 MG 24 hr capsule and patient is currently in [place] and [facility] [place], is this ok? Please advise

## 2015-11-25 NOTE — Telephone Encounter (Signed)
Can be reached: [phone]   Reason for call: Pt is going out of town next week for work and had to reschedule his appt. He will be out of adderall before he comes in and he's out of town next week when refill is due. He is requesting to pick up the RX early so he doesn't run out.

## 2015-11-25 NOTE — Telephone Encounter (Signed)
I have no problem, but the pharmacy in [place] may refuse to fill since it is a prescription prescribed from another state.

## 2015-11-25 NOTE — Telephone Encounter (Signed)
Notified pt of RX and 72 hr notice. He will be out of town 6/14 so we may receive call for approval to fill adderall.

## 2015-11-25 NOTE — Telephone Encounter (Signed)
I have printed Rx for him to pick up. Can fill when due.  In the future he needs to know there is a 72-hour turn around time for controlled medications so he may not be able to get refill last minute the next time.

## 2015-11-30 ENCOUNTER — Ambulatory Visit: Payer: Self-pay | Admitting: Physician Assistant

## 2015-12-07 ENCOUNTER — Encounter: Payer: Self-pay | Admitting: Physician Assistant

## 2015-12-07 ENCOUNTER — Telehealth: Payer: Self-pay | Admitting: *Deleted

## 2015-12-07 ENCOUNTER — Ambulatory Visit (INDEPENDENT_AMBULATORY_CARE_PROVIDER_SITE_OTHER): Payer: Managed Care, Other (non HMO) | Admitting: Physician Assistant

## 2015-12-07 VITALS — BP 112/68 | HR 84 | Temp 98.4°F | Resp 16 | Ht 73.0 in | Wt 203.5 lb

## 2015-12-07 DIAGNOSIS — F909 Attention-deficit hyperactivity disorder, unspecified type: Secondary | ICD-10-CM | POA: Diagnosis not present

## 2015-12-07 DIAGNOSIS — F988 Other specified behavioral and emotional disorders with onset usually occurring in childhood and adolescence: Secondary | ICD-10-CM

## 2015-12-07 NOTE — Telephone Encounter (Signed)
Patient's Insurance benefits has changed and we need to initiated a PA for his Adderall 20mg  XR once daily; [facility]ena [facility] states that his need for the XR is his work schedule changes week to week. New card is scanned into documents/SLS 06/21 Thanks so much.

## 2015-12-07 NOTE — Patient Instructions (Signed)
I am glad you are doing well overall.  We will work on getting a Prior Authorization approved by [employer] so that you can continue the extended release Adderall.  If they will not approve, then we can switch to a regular release formulation that should be cheaper for you.  Make sure to hydrate well and limit caffeine intake while on this medication.

## 2015-12-07 NOTE — Telephone Encounter (Signed)
[facility] awaiting determination. JG//CMA

## 2015-12-07 NOTE — Progress Notes (Signed)
Pre visit review using our clinic review tool, if applicable. No additional management support is needed unless otherwise documented below in the visit note/SLS  

## 2015-12-07 NOTE — Progress Notes (Signed)
Patient presents to clinic today for follow-up of ADD. Patient is currently on Adderall XR 20 mg daily. Endorses taking medication as directed. Denies side effect of medication. Endorses good focus with this medicine. Work performance has improved. Is still sleeping well. Denies decreased appetite.   Past Medical History  Diagnosis Date  . Anxiety   . History of tinnitus     & hearing loss- Followed by ENT-Dr. Wilburn Cornelia  . Positional vertigo   . Chicken pox     Current Outpatient Prescriptions on File Prior to Visit  Medication Sig Dispense Refill  . amphetamine-dextroamphetamine (ADDERALL XR) 20 MG 24 hr capsule Take 1 capsule (20 mg total) by mouth every morning. 30 capsule 0  . aspirin 81 MG tablet Take 81 mg by mouth daily.    . citalopram (CELEXA) 40 MG tablet TAKE 1 TABLET BY MOUTH EVERY DAY (Patient taking differently: TAKE 1/2 TABLET BY MOUTH EVERY DAY) 30 tablet 5  . Multiple Vitamin (MULTIVITAMIN) capsule Take 1 capsule by mouth daily.      . [DISCONTINUED] zolpidem (AMBIEN) 5 MG tablet Take 1 tablet (5 mg total) by mouth at bedtime as needed for sleep. 30 tablet 3   No current facility-administered medications on file prior to visit.    No Known Allergies  Family History  Problem Relation Age of Onset  . Heart disease Neg Hx   . Stroke Neg Hx   . Diabetes Neg Hx   . Hypertension Neg Hx   . Breast cancer Neg Hx   . Colon cancer Neg Hx   . Prostate cancer Neg Hx     Social History   Social History  . Marital Status: Married    Spouse Name: N/A  . Number of Children: N/A  . Years of Education: N/A   Social History Main Topics  . Smoking status: Former Research scientist (life sciences)  . Smokeless tobacco: Current User    Types: Chew     Comment: history of 1 ppd for 7-8 years. Currently using nicotine gum to assist stopping chewing tobacco.  . Alcohol Use: 8.4 oz/week    14 Cans of beer per week  . Drug Use: No  . Sexual Activity: Not Asked   Other Topics Concern  . 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  All other ROS are negative.  BP 112/68 mmHg  Pulse 84  Temp(Src) 98.4 F (36.9 C) (Oral)  Resp 16  Ht _0  (1.854 m)  Wt 203 lb 8 oz (92.307 kg)  BMI 26.85 kg/m2  SpO2 98%  Physical Exam  Constitutional: He is oriented to person, place, and time and well-developed, well-nourished, and in no distress.  HENT:  Head: Normocephalic and atraumatic.  Eyes: Conjunctivae are normal.  Neck: Neck supple.  Cardiovascular: Normal rate, regular rhythm, normal heart sounds and intact distal pulses.   Pulmonary/Chest: Effort normal and breath sounds normal. No respiratory distress. He has no wheezes. He has no rales. He exhibits no tenderness.  Neurological: He is alert and oriented to person, place, and time.  Skin: Skin is warm and dry. No rash noted.  Psychiatric: Affect normal.  Vitals reviewed.   Recent Results (from the past 2160 hour(s))  CBC     Status: Abnormal   Collection Time: 09/14/15  8:21 AM  Result Value Ref Range  WBC 3.5 (L) 4.0 - 10.5 K/uL   RBC 4.33 4.22 - 5.81 Mil/uL   Platelets 231.0 150.0 - 400.0 K/uL   Hemoglobin 13.8 13.0 - 17.0 g/dL   HCT 39.7 39.0 - 52.0 %   MCV 91.7 78.0 - 100.0 fl   MCHC 34.6 30.0 - 36.0 g/dL   RDW 12.6 11.5 - 15.5 %  Comp Met (CMET)     Status: Abnormal   Collection Time: 09/14/15  8:21 AM  Result Value Ref Range   Sodium 141 135 - 145 mEq/L   Potassium 4.6 3.5 - 5.1 mEq/L   Chloride 104 96 - 112 mEq/L   CO2 31 19 - 32 mEq/L   Glucose, Bld 103 (H) 70 - 99 mg/dL   BUN 15 6 - 23 mg/dL   Creatinine, Ser 0.88 0.40 - 1.50 mg/dL   Total Bilirubin 0.6 0.2 - 1.2 mg/dL   Alkaline Phosphatase 45 39 - 117 U/L   AST 27 0 - 37 U/L   ALT 28 0 - 53 U/L   Total Protein 7.1 6.0 - 8.3 g/dL   Albumin 4.7 3.5 - 5.2 g/dL   Calcium 10.0 8.4 - 10.5 mg/dL   GFR 97.90 >60.00 mL/min  TSH     Status: None    Collection Time: 09/14/15  8:21 AM  Result Value Ref Range   TSH 2.25 0.35 - 4.50 uIU/mL  Hemoglobin A1c     Status: None   Collection Time: 09/14/15  8:21 AM  Result Value Ref Range   Hgb A1c MFr Bld 5.6 4.6 - 6.5 %    Comment: Glycemic Control Guidelines for People with Diabetes:Non Diabetic:  <6%Goal of Therapy: <7%Additional Action Suggested:  >8%   Urinalysis, Routine w reflex microscopic     Status: None   Collection Time: 09/14/15  8:21 AM  Result Value Ref Range   Color, Urine YELLOW Yellow;Lt. Yellow   APPearance CLEAR Clear   Specific Gravity, Urine 1.020 1.000-1.030   pH 5.5 5.0 - 8.0   Total Protein, Urine NEGATIVE Negative   Urine Glucose NEGATIVE Negative   Ketones, ur NEGATIVE Negative   Bilirubin Urine NEGATIVE Negative   Hgb urine dipstick NEGATIVE Negative   Urobilinogen, UA 0.2 0.0 - 1.0   Leukocytes, UA NEGATIVE Negative   Nitrite NEGATIVE Negative   WBC, UA 0-2/hpf 0-2/hpf   RBC / HPF none seen 0-2/hpf   Squamous Epithelial / LPF Rare(0-4/hpf) Rare(0-4/hpf)  Lipid Profile     Status: Abnormal   Collection Time: 09/14/15  8:21 AM  Result Value Ref Range   Cholesterol 242 (H) 0 - 200 mg/dL    Comment: ATP III Classification       Desirable:  < 200 mg/dL               Borderline High:  200 - 239 mg/dL          High:  > = 240 mg/dL   Triglycerides 97.0 0.0 - 149.0 mg/dL    Comment: Normal:  <150 mg/dLBorderline High:  150 - 199 mg/dL   HDL 72.10 >39.00 mg/dL   VLDL 19.4 0.0 - 40.0 mg/dL   LDL Cholesterol 150 (H) 0 - 99 mg/dL   Total CHOL/HDL Ratio 3     Comment:                Men          Women1/2 Average Risk     3.4  3.3Average Risk          5.0          4.42X Average Risk          9.6          7.13X Average Risk          15.0          11.0                       NonHDL 169.63     Comment: NOTE:  Non-HDL goal should be 30 mg/dL higher than patient's LDL goal (i.e. LDL goal of < 70 mg/dL, would have non-HDL goal of < 100 mg/dL)  PSA     Status: None    Collection Time: 09/14/15  8:21 AM  Result Value Ref Range   PSA 0.81 0.10 - 4.00 ng/mL  Testosterone     Status: None   Collection Time: 09/14/15  8:21 AM  Result Value Ref Range   Testosterone 379.41 300.00 - 890.00 ng/dL    Assessment/Plan: ADD (attention deficit disorder) Is doing very well on current regimen. His insurance just changed, and new insurance will not pay for the extended release version. We'll attempt to initiate prior authorization for the extended release capsule. If improved will continue. If not improved, will switch to short acting Adderall 20 mg twice a day. Controlled substance contract reviewed and signed today. Will obtain urine drug screen at follow-up 3 months.     Leeanne Rio, PA-C

## 2015-12-07 NOTE — Assessment & Plan Note (Signed)
Is doing very well on current regimen. His insurance just changed, and new insurance will not pay for the extended release version. We'll attempt to initiate prior authorization for the extended release capsule. If improved will continue. If not improved, will switch to short acting Adderall 20 mg twice a day. Controlled substance contract reviewed and signed today. Will obtain urine drug screen at follow-up 3 months.

## 2015-12-27 ENCOUNTER — Telehealth: Payer: Self-pay | Admitting: Physician Assistant

## 2015-12-27 NOTE — Telephone Encounter (Signed)
Pt is requesting a refill on her Adderall Rx.  - pt would like to have spouse pick up medication. Informed not showing her name on DPR. He says that he knows his PCP really well and he will release it to her. Pt says that he is currently out of town.   CB: [phone]

## 2015-12-27 NOTE — Telephone Encounter (Signed)
Refill request for Adderall 20mg  Last filled by MD on - 11/25/15, #30x0 Last AEX - 12/07/15 Please Advise on refills/SLS 07/11

## 2015-12-27 NOTE — Telephone Encounter (Signed)
Ok to print refills for the next 2 months. I will sign once printed. Person picking up script (if not patient) will have to give ID.

## 2015-12-28 MED ORDER — AMPHETAMINE-DEXTROAMPHET ER 20 MG PO CP24: 20.0000 mg | ORAL_CAPSULE | ORAL | Status: DC

## 2015-12-28 NOTE — Telephone Encounter (Signed)
Rx printed for provider signature upon return to office Fri, 12/30/15//SLS 07/12 

## 2015-12-30 NOTE — Telephone Encounter (Signed)
Picked-up by patient his Am/SLS 07/14

## 2016-02-27 ENCOUNTER — Telehealth: Payer: Self-pay | Admitting: Physician Assistant

## 2016-02-27 ENCOUNTER — Encounter: Payer: Self-pay | Admitting: Physician Assistant

## 2016-02-27 MED ORDER — AMPHETAMINE-DEXTROAMPHET ER 20 MG PO CP24: 20.0000 mg | ORAL_CAPSULE | ORAL | 0 refills | Status: DC

## 2016-02-27 NOTE — Addendum Note (Signed)
Addended by: Marcelline MatesMARTIN, Arhianna Ebey on: 02/27/2016 10:02 AM   Modules accepted: Orders

## 2016-02-27 NOTE — Telephone Encounter (Signed)
Printed and signed.  Will have to pick up and give UDS at time of pick up.

## 2016-02-27 NOTE — Telephone Encounter (Signed)
LMOM with contact name and number [for return call, if needed] RE: Rx ready for p/u during regular business hours per further instructions/SLS 09/11

## 2016-02-27 NOTE — Telephone Encounter (Signed)
Refill request for Adderall 20mg  XL Last filled by MD on - Fill on or after 01/27/16, #30x0 Last AEX - 12/07/15, was to have UDS [no record in EMR] Next AEX - 3-Mths Please Advise on refills/SLS 09/11

## 2016-02-27 NOTE — Telephone Encounter (Signed)
Pt called in to request a refill on his Adderall    CB: [phone]

## 2016-03-14 ENCOUNTER — Telehealth: Payer: Self-pay | Admitting: Physician Assistant

## 2016-03-14 NOTE — Telephone Encounter (Signed)
Received UDS results -- negative for prescribed Adderal. [place] Controlled Substance Database reviewed -- Is filling every 30 days from same pharmacy. Last Note reviewed -- 12/07/15 -- patient taking medication every day.  Will mark high-risk for now -- will repeat UDS at next Rx request.  If persistently negative, will no longer prescribe controlled substances.

## 2016-03-29 ENCOUNTER — Telehealth: Payer: Self-pay | Admitting: Physician Assistant

## 2016-03-29 NOTE — Telephone Encounter (Signed)
Relation to ZO:XWRUpt:self Call back number:[phone]   Reason for call:  Patient requesting a refill amphetamine-dextroamphetamine (ADDERALL XR) 20 MG 24 hr capsule

## 2016-03-30 MED ORDER — AMPHETAMINE-DEXTROAMPHET ER 20 MG PO CP24: 20.0000 mg | ORAL_CAPSULE | ORAL | 0 refills | Status: DC

## 2016-03-30 NOTE — Telephone Encounter (Signed)
Requesting:   Adderall Contract   Signed on 02/27/2016 UDS  High Risk, next due 03/28/2016 Last OV    12/07/2015 Last Refill    #30 with 0 refills on 02/27/2016  Please Advise

## 2016-03-30 NOTE — Telephone Encounter (Signed)
Printed and covering PCP signed Called the patient informed refill done/NEEDS UDS, note attached to hardcopy as well. Patient verbally understood/will pickup on Monday 04/02/16.

## 2016-03-30 NOTE — Telephone Encounter (Signed)
Okay #30, no refills, needs UDS at time of prescription pick up

## 2016-04-23 ENCOUNTER — Encounter: Payer: Self-pay | Admitting: Physician Assistant

## 2016-05-01 ENCOUNTER — Telehealth: Payer: Self-pay | Admitting: Physician Assistant

## 2016-05-01 NOTE — Telephone Encounter (Signed)
Please reach out to patient to verify how he takes his Adderall. If it is 7 days a week or only on week days, etc. Thank you.

## 2016-05-01 NOTE — Telephone Encounter (Signed)
Verified with patient and he usually takes the Adderall Mon-Fri. His job has been doing overtime and he having to take 7 days a week. 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

## 2016-05-01 NOTE — Telephone Encounter (Signed)
Noted! Thank you

## 2016-05-24 ENCOUNTER — Other Ambulatory Visit: Payer: Self-pay | Admitting: Physician Assistant

## 2016-05-24 NOTE — Telephone Encounter (Signed)
Last OV 12/07/15 adderall last filled 03/30/16 #30 with 0

## 2016-05-24 NOTE — Telephone Encounter (Signed)
Patient's wife walked in to office to pick up patient's rx for amphetamine-dextroamphetamine (ADDERALL XR) 20 MG 24 hr capsule.  I informed her there was not a rx available in the office for pick up and that I would be more than happy to enter the request at this time.  She was informed that patient's pcp is out of the office this afternoon and will return tomorrow.  She agreed to this but was upset that this was not done as she states patient called office earlier today to request rx and advise that she was going to pick it.  I apologized to her for the inconvenience and agreed to send the request back.

## 2016-05-25 MED ORDER — AMPHETAMINE-DEXTROAMPHET ER 20 MG PO CP24: 20.0000 mg | ORAL_CAPSULE | ORAL | 0 refills | Status: DC

## 2016-05-25 NOTE — Telephone Encounter (Signed)
Advised patient rx for Adderall was ready for pick up, up front. Advised patient would need to make an appt every 6 months to continue refills. Last OV was 12/07/15.

## 2016-05-25 NOTE — Addendum Note (Signed)
Addended by: Marcelline MatesMARTIN, Rosario Kushner on: 05/25/2016 07:47 AM   Modules accepted: Orders

## 2016-05-25 NOTE — Telephone Encounter (Signed)
I have printed refill and is ready for pickup at the front desk. Apologize for any confusion yesterday but we never received a phone note regarding refill or that patient had called the office.   I will need to see him for follow-up before further refills can be given (every 6 months).

## 2016-06-21 ENCOUNTER — Telehealth: Payer: Self-pay | Admitting: Physician Assistant

## 2016-06-21 DIAGNOSIS — F988 Other specified behavioral and emotional disorders with onset usually occurring in childhood and adolescence: Secondary | ICD-10-CM

## 2016-06-21 MED ORDER — AMPHETAMINE-DEXTROAMPHET ER 20 MG PO CP24: 20.0000 mg | ORAL_CAPSULE | ORAL | 0 refills | Status: DC

## 2016-06-21 NOTE — Telephone Encounter (Signed)
Ok to print 30 day supply. Must give UDS at pickup. No further refills without follow-up appointment.

## 2016-06-21 NOTE — Telephone Encounter (Signed)
Rx ready up front for pick up

## 2016-06-21 NOTE — Telephone Encounter (Signed)
LMOVM advising pt rx for the Adderall was ready for pick up. He will need to give a UDS before picking up rx and scheduled an follow up appointment for more refills.

## 2016-06-21 NOTE — Telephone Encounter (Signed)
Pt needs refill adderall, pt will be going out of town tomorrow and would like be able to pick up today, please call pt when ready.

## 2016-06-21 NOTE — Telephone Encounter (Signed)
Refill request for Adderall Last filled 05/25/16 #30 0RF Last ov: 12/07/15 CSC: none on file UDS: 04/23/16 moderate risk recheck in 3 months Please advise of refill

## 2016-07-11 ENCOUNTER — Ambulatory Visit (INDEPENDENT_AMBULATORY_CARE_PROVIDER_SITE_OTHER): Payer: Managed Care, Other (non HMO) | Admitting: Physician Assistant

## 2016-07-11 ENCOUNTER — Encounter: Payer: Self-pay | Admitting: Physician Assistant

## 2016-07-11 DIAGNOSIS — F988 Other specified behavioral and emotional disorders with onset usually occurring in childhood and adolescence: Secondary | ICD-10-CM

## 2016-07-11 NOTE — Patient Instructions (Addendum)
We are recollecting the urine today for assessment. If all looks good we will change prescription to a shorter acting Adderall.

## 2016-07-11 NOTE — Progress Notes (Signed)
Patient presents to clinic today for follow-up of ADD. Is currently on Adderall XR 20 mg. Is taking daily about 6 AM. Endorses that medication is helping with symptoms but is long lasting and sometimes affecting sleep. Endorses price of XR has gone up to 150.00 for a 30-day supply. Would like to talk about other options.  Last dose of Adderall this morning.    Past Medical History:  Diagnosis Date  . Anxiety   . Chicken pox   . History of tinnitus    & hearing loss- Followed by ENT-Dr. Annalee Genta  . Positional vertigo     Current Outpatient Prescriptions on File Prior to Visit  Medication Sig Dispense Refill  . amphetamine-dextroamphetamine (ADDERALL XR) 20 MG 24 hr capsule Take 1 capsule (20 mg total) by mouth every morning. 30 capsule 0  . aspirin 81 MG tablet Take 81 mg by mouth daily.    . citalopram (CELEXA) 40 MG tablet TAKE 1 TABLET BY MOUTH EVERY DAY (Patient taking differently: TAKE 1/2 TABLET BY MOUTH EVERY DAY) 30 tablet 5  . Multiple Vitamin (MULTIVITAMIN) capsule Take 1 capsule by mouth daily.      . [DISCONTINUED] zolpidem (AMBIEN) 5 MG tablet Take 1 tablet (5 mg total) by mouth at bedtime as needed for sleep. 30 tablet 3   No current facility-administered medications on file prior to visit.     No Known Allergies  Family History  Problem Relation Age of Onset  . Heart disease Neg Hx   . Stroke Neg Hx   . Diabetes Neg Hx   . Hypertension Neg Hx   . Breast cancer Neg Hx   . Colon cancer Neg Hx   . Prostate cancer Neg Hx     Social History   Social History  . Marital status: Married    Spouse name: N/A  . Number of children: N/A  . Years of education: N/A   Social History Main Topics  . Smoking status: Former Games developer  . Smokeless tobacco: Current User    Types: Chew     Comment: history of 1 ppd for 7-8 years. Currently using nicotine gum to assist stopping chewing tobacco.  . Alcohol use 8.4 oz/week    14 Cans of beer per week  . Drug use: No  .  Sexual activity: Not Asked   Other Topics Concern  . 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  All other ROS are negative.  BP 130/86   Pulse 91   Temp 98 F (36.7 C) (Oral)   Resp 16   Ht 6\' 1"  (1.854 m)   Wt 202 lb (91.6 kg)   SpO2 98%   BMI 26.65 kg/m   Physical Exam  Constitutional: He is oriented to person, place, and time and well-developed, well-nourished, and in no distress.  Cardiovascular: Normal rate, regular rhythm, normal heart sounds and intact distal pulses.   Pulmonary/Chest: Effort normal and breath sounds normal. No respiratory distress. He has no wheezes. He has no rales. He exhibits no tenderness.  Neurological: He is alert and oriented to person, place, and time.  Skin: Skin is warm and dry. No rash noted.  Psychiatric: Affect normal.  Vitals reviewed.  Assessment/Plan: ADD (attention deficit disorder) NEgative UDS last check but patient out of medication. Will repeat UDS today. If all checks out will  continue RX -- changing to Adderall 20 mg BID instead of XR for cost.     Piedad ClimesMartin, Infantof Villagomez [name], PA-C

## 2016-07-11 NOTE — Progress Notes (Signed)
Pre visit review using our clinic review tool, if applicable. No additional management support is needed unless otherwise documented below in the visit note. 

## 2016-07-11 NOTE — Assessment & Plan Note (Signed)
NEgative UDS last check but patient out of medication. Will repeat UDS today. If all checks out will continue RX -- changing to Adderall 20 mg BID instead of XR for cost.

## 2016-07-12 ENCOUNTER — Encounter: Payer: Self-pay | Admitting: Physician Assistant

## 2016-07-27 ENCOUNTER — Telehealth: Payer: Self-pay | Admitting: Physician Assistant

## 2016-07-27 MED ORDER — AMPHETAMINE-DEXTROAMPHETAMINE 20 MG PO TABS: 20.0000 mg | ORAL_TABLET | Freq: Two times a day (BID) | ORAL | 0 refills | Status: DC

## 2016-07-27 NOTE — Telephone Encounter (Signed)
UDS results received. No red flags. Moderate risk -- repeat 6 months. [place] Database reviewed today. No red flags. CSC on file.  Rx changed to Adderall (short-acting) 20 mg twice daily. Ok to pick up Rx. FU 1 month for reassessment.

## 2016-07-27 NOTE — Telephone Encounter (Signed)
Left message on patients cell phone voicemail that RX is ready for pickup.   Note added to RX in file that a 1 month follow-up appointment is needed.

## 2016-07-27 NOTE — Telephone Encounter (Signed)
Last rx 06/21/16 # 30 Last ov: 07/11/16 CSC signed: Yes 12/07/15 UDS: 07/11/16 Will get recent UDS results before rx for you.

## 2016-07-27 NOTE — Telephone Encounter (Signed)
Pt is needing a refill on Adderall and asking if he could change it to have one in the morning and one in the afternoon, not being time released. Pt states that some afternoons he feels that he doesn't need to take Rx. Pt did states that he is out and can come by this afternoon to p/u Rx.

## 2016-08-28 ENCOUNTER — Ambulatory Visit: Payer: Self-pay | Admitting: Physician Assistant

## 2016-09-03 ENCOUNTER — Encounter: Payer: Self-pay | Admitting: Physician Assistant

## 2016-09-03 ENCOUNTER — Ambulatory Visit (INDEPENDENT_AMBULATORY_CARE_PROVIDER_SITE_OTHER): Payer: Managed Care, Other (non HMO) | Admitting: Physician Assistant

## 2016-09-03 VITALS — BP 130/78 | HR 89 | Temp 98.3°F | Resp 14 | Ht 73.0 in | Wt 197.0 lb

## 2016-09-03 DIAGNOSIS — M7702 Medial epicondylitis, left elbow: Secondary | ICD-10-CM

## 2016-09-03 DIAGNOSIS — F988 Other specified behavioral and emotional disorders with onset usually occurring in childhood and adolescence: Secondary | ICD-10-CM | POA: Diagnosis not present

## 2016-09-03 MED ORDER — AMPHETAMINE-DEXTROAMPHETAMINE 20 MG PO TABS: 20.0000 mg | ORAL_TABLET | Freq: Two times a day (BID) | ORAL | 0 refills | Status: DC

## 2016-09-03 NOTE — Assessment & Plan Note (Signed)
Doing well with current regimen. Vitals are stable. Medication refill given. CSC on file UDS up-to-date. Repeat at follow-up.

## 2016-09-03 NOTE — Patient Instructions (Addendum)
Please continue medications as directed. I am glad you are doing so well on this regimen.   Follow-up in 3 months.   For the elbow, please get a brace at the pharmacy like the one we have shown you.  Apply topical Icy Hot or Aspercreme to the area. Limit heavy lifting when possible to let the area calm down.  Follow-up if not resolving.    Golfer's Elbow Rehab Ask your health care provider which exercises are safe for you. Do exercises exactly as told by your health care provider and adjust them as directed. It is normal to feel mild stretching, pulling, tightness, or discomfort as you do these exercises, but you should stop right away if you feel sudden pain or your pain gets worse. Do not begin these exercises until told by your health care provider. Stretching and range of motion exercises These exercises warm up your muscles and joints and improve the movement and flexibility of your elbow. Exercise A: Wrist flexors   1. Straighten your left / right elbow in front of you with your palm up. If told by your health care provider, do this stretch with your elbow bent rather than straight. 2. With your other hand, gently pull your left / right hand and fingers toward you until you feel a gentle stretch on the top of your forearm. 3. Hold this position for __________ seconds. Repeat __________ times. Complete this exercise __________ times a day. Strengthening exercises These exercises build strength and endurance in your elbow. Endurance is the ability to use your muscles for a long time, even after several repetitions. Exercise B: Wrist flexion   1. Sit with your left / right forearm palm-up and supported on a table or other surface. 2. Let your left / right wrist extend over the edge of the surface. 3. Hold a __________ weight or a piece of rubber exercise band or tubing. Slowly curl your hand up toward your forearm. Try to only move your hand and keep the rest of your arm  still. 4. Hold this position for __________ seconds. 5. Slowly return to the starting position. Repeat __________ times. Complete this exercise __________ times a day. Exercise C: Eccentric wrist flexion  1. Sit with your left / right forearm palm-up and supported on a table or other surface. 2. Let your left / right wrist extend over the edge of the surface. 3. Hold a __________ weight or a piece of rubber exercise band or tubing. 4. Use your other hand to move your left / right hand up toward your forearm. 5. Slowly return to the starting position using only your left / right hand. Repeat __________ times. Complete this exercise __________ times a day. Exercise D: Hand turns, pronation i  1. Sit with your left / right forearm supported on a table or other surface. 2. Move your wrist so your pinkie travels toward your forearm and your thumb moves away from your forearm. 3. Hold this position for __________ seconds. 4. Slowly return to the starting position. Exercise E: Hand turns, pronation ii   1. Sit with your left / right forearm supported on a table or other surface. 2. Hold a hammer in your left / right hand.  The exercise will be easier if you hold on near the head of the hammer.  If you hold on toward the end of the handle, the exercise will be harder. 3. Rest your left / right hand over the edge of the table with your  palm facing up. 4. Without moving your elbow, slowly turn your palm and your hand down toward the table. 5. Hold this position for __________ seconds. 6. Slowly return to the starting position. Repeat __________ times. Complete this exercise __________ times a day. Exercise F: Shoulder blade squeeze  1. Sit in a stable chair with good posture. Do not let your back touch the back of the chair. 2. Your arms should be at your sides with your elbows bent. You can rest your forearms on a pillow. 3. Squeeze your shoulder blades together. Keep your shoulders level. Do  not lift your shoulders up toward your ears. 4. Hold this position for __________ seconds. 5. Slowly release. Repeat __________ times. Complete this exercise __________ times a day. This information is not intended to replace advice given to you by your health care provider. Make sure you discuss any questions you have with your health care provider. Document Released: 06/04/2005 Document Revised: 02/09/2016 Document Reviewed: 02/14/2015 Elsevier Interactive Patient Education  2017 [employer]

## 2016-09-03 NOTE — Progress Notes (Signed)
Pre visit review using our clinic review tool, if applicable. No additional management support is needed unless otherwise documented below in the visit note. 

## 2016-09-03 NOTE — Progress Notes (Signed)
Patient presents to clinic today for ADD follow-up. Changed Adderall dosing to 20 mg BID. Currently taking one dose every AM, and an afternoon dose as needed. States this is working much better for him than the extended release tablets. Denies issues with insomnia, anxiety, agitation. States he is having no issues at work with medication. Denies chest pain, palpitations, diarrhea, constipation, hematochezia, dizziness, lightheadedness.  Additionally c/o left elbow pain. States that he has been lifting a lot more at work lately. Pain is located on medial side of left elbow. Describes pain as a 4-5/10 severity that is worse with lifting and palpation over the area. Denies bruising, redness or swelling. Denies injury/trauma to the elbow. States pain is not located in the joint. Currently taking Aleve every 2-3 days as needed.   Past Medical History:  Diagnosis Date  . Anxiety   . Chicken pox   . History of tinnitus    & hearing loss- Followed by ENT-Dr. Annalee GentaShoemaker  . Positional vertigo     Current Outpatient Prescriptions on File Prior to Visit  Medication Sig Dispense Refill  . aspirin 81 MG tablet Take 81 mg by mouth daily.    . citalopram (CELEXA) 40 MG tablet TAKE 1 TABLET BY MOUTH EVERY DAY (Patient taking differently: TAKE 1/2 TABLET BY MOUTH EVERY DAY) 30 tablet 5  . Multiple Vitamin (MULTIVITAMIN) capsule Take 1 capsule by mouth daily.      . [DISCONTINUED] zolpidem (AMBIEN) 5 MG tablet Take 1 tablet (5 mg total) by mouth at bedtime as needed for sleep. 30 tablet 3   No current facility-administered medications on file prior to visit.     No Known Allergies  Family History  Problem Relation Age of Onset  . Heart disease Neg Hx   . Stroke Neg Hx   . Diabetes Neg Hx   . Hypertension Neg Hx   . Breast cancer Neg Hx   . Colon cancer Neg Hx   . Prostate cancer Neg Hx     Social History   Social History  . Marital status: Married    Spouse name: N/A  . Number of children:  N/A  . Years of education: N/A   Social History Main Topics  . Smoking status: Former Games developermoker  . Smokeless tobacco: Current User    Types: Chew     Comment: history of 1 ppd for 7-8 years. Currently using nicotine gum to assist stopping chewing tobacco.  . Alcohol use 8.4 oz/week    14 Cans of beer per week  . Drug use: No  . Sexual activity: Not Asked   Other Topics Concern  . 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  All other ROS are negative.  BP 130/78   Pulse 89   Temp 98.3 F (36.8 C) (Oral)   Resp 14   Ht 6\' 1"  (1.854 m)   Wt 197 lb (89.4 kg)   SpO2 99%   BMI 25.99 kg/m   Physical Exam  Constitutional: He is oriented to person, place, and time and well-developed, well-nourished, and in no distress. No distress.  Eyes: Conjunctivae are normal. Pupils are equal, round, and reactive to light. No scleral icterus.  Cardiovascular: Normal rate, regular rhythm and normal heart sounds.  Exam reveals no gallop and no friction rub.   No murmur heard. Pulmonary/Chest: Effort  normal and breath sounds normal. No respiratory distress. He has no wheezes. He has no rales.  Musculoskeletal:       Right elbow: Normal.He exhibits normal range of motion and no swelling. No tenderness found.       Left elbow: He exhibits normal range of motion, no swelling (no erythema or bruising) and no deformity. Tenderness found. Medial epicondyle tenderness noted. No lateral epicondyle tenderness noted.  Neurological: He is alert and oriented to person, place, and time.  Skin: Skin is warm and dry. He is not diaphoretic.  Psychiatric: Affect normal.   Assessment/Plan: ADD (attention deficit disorder) Doing well with current regimen. Vitals are stable. Medication refill given. CSC on file UDS up-to-date. Repeat at follow-up.  Medial epicondylitis of elbow, left Mild, intermittent. Discussed elbow  strapping.  Topical NSAIDs and OTC medications reviewed.    Piedad Climes, PA-C

## 2016-09-03 NOTE — Assessment & Plan Note (Signed)
Mild, intermittent. Discussed elbow strapping.  Topical NSAIDs and OTC medications reviewed.

## 2016-10-05 ENCOUNTER — Other Ambulatory Visit: Payer: Self-pay | Admitting: Physician Assistant

## 2016-10-05 MED ORDER — AMPHETAMINE-DEXTROAMPHETAMINE 20 MG PO TABS: 20.0000 mg | ORAL_TABLET | Freq: Two times a day (BID) | ORAL | 0 refills | Status: DC

## 2016-10-05 NOTE — Telephone Encounter (Signed)
Pt needs refill on adderall °

## 2016-10-05 NOTE — Telephone Encounter (Signed)
Called patient but his phone was not currently working.

## 2016-10-05 NOTE — Telephone Encounter (Signed)
Refill ready for pick up 

## 2016-10-05 NOTE — Telephone Encounter (Signed)
Last filled 09/03/16 #60 0 UDS: 07/12/16 Low risk CSC: 12/07/15 at SW Last OV: 09/03/16  Please advise

## 2016-11-05 ENCOUNTER — Telehealth: Payer: Self-pay | Admitting: Physician Assistant

## 2016-11-05 NOTE — Telephone Encounter (Signed)
Adderall last filled 10/05/16 #60 0RF UDS: 07/12/16 low risk CSC: 12/07/15 at [facility]

## 2016-11-05 NOTE — Telephone Encounter (Signed)
Pt asking for refill on adderall, please call when ready for p/u.

## 2016-11-06 MED ORDER — AMPHETAMINE-DEXTROAMPHETAMINE 20 MG PO TABS: 20.0000 mg | ORAL_TABLET | Freq: Two times a day (BID) | ORAL | 0 refills | Status: DC

## 2016-11-06 NOTE — Telephone Encounter (Signed)
One month Rx granted. Is due for follow-up next month.  Will update CSC at that time.  Refill ready for pickup.

## 2016-11-06 NOTE — Telephone Encounter (Signed)
Called the mobile number but was not working. LMOVM at home number. Rx is ready at the front desk. Please have patient schedule an appointment for June for follow up

## 2016-11-07 NOTE — Telephone Encounter (Signed)
Patient picked up RX and [facility]

## 2016-12-05 ENCOUNTER — Ambulatory Visit: Payer: Self-pay | Admitting: Physician Assistant

## 2017-02-04 ENCOUNTER — Encounter: Payer: Self-pay | Admitting: Physician Assistant

## 2017-02-04 ENCOUNTER — Ambulatory Visit (INDEPENDENT_AMBULATORY_CARE_PROVIDER_SITE_OTHER): Payer: No Typology Code available for payment source | Admitting: Physician Assistant

## 2017-02-04 VITALS — BP 110/72 | HR 81 | Temp 98.4°F | Resp 14 | Ht 73.0 in | Wt 196.0 lb

## 2017-02-04 DIAGNOSIS — F988 Other specified behavioral and emotional disorders with onset usually occurring in childhood and adolescence: Secondary | ICD-10-CM | POA: Diagnosis not present

## 2017-02-04 DIAGNOSIS — F329 Major depressive disorder, single episode, unspecified: Secondary | ICD-10-CM

## 2017-02-04 DIAGNOSIS — F419 Anxiety disorder, unspecified: Secondary | ICD-10-CM | POA: Diagnosis not present

## 2017-02-04 MED ORDER — CITALOPRAM HYDROBROMIDE 40 MG PO TABS: 20.0000 mg | ORAL_TABLET | Freq: Every day | ORAL | 1 refills | Status: DC

## 2017-02-04 MED ORDER — AMPHETAMINE-DEXTROAMPHETAMINE 20 MG PO TABS: 20.0000 mg | ORAL_TABLET | Freq: Two times a day (BID) | ORAL | 0 refills | Status: DC

## 2017-02-04 NOTE — Progress Notes (Signed)
Patient presents to clinic today for follow-up of ADD/anxiety.   ADD -- Previously on Adderall 20 mg BID. 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 Is working for himself. Is taking 1/2 tablet twice daily for the most part due to decreased stressors but has noted inattentiveness. Is considering resuming 20 mg AM and 10 mg PM.   Anxiety -- Previously on Citalopram 40 mg daily. Did cut back to 20 mg daily after change in jobs. Notes good relief of symptoms with cutting back to 20 mg daily.   Tobacco Use -- Has stopped chewing tobacco with the nicotine gum.  Past Medical History:  Diagnosis Date  . Anxiety   . Chicken pox   . History of tinnitus    & hearing loss- Followed by ENT-Dr. Annalee Genta  . Positional vertigo     Current Outpatient Prescriptions on File Prior to Visit  Medication Sig Dispense Refill  . aspirin 81 MG tablet Take 81 mg by mouth daily.    . [DISCONTINUED] zolpidem (AMBIEN) 5 MG tablet Take 1 tablet (5 mg total) by mouth at bedtime as needed for sleep. 30 tablet 3   No current facility-administered medications on file prior to visit.     No Known Allergies  Family History  Problem Relation Age of Onset  . Heart disease Neg Hx   . Stroke Neg Hx   . Diabetes Neg Hx   . Hypertension Neg Hx   . Breast cancer Neg Hx   . Colon cancer Neg Hx   . Prostate cancer Neg Hx     Social History   Social History  . Marital status: Married    Spouse name: N/A  . Number of children: N/A  . Years of education: N/A   Social History Main Topics  . Smoking status: Former Games developer  . Smokeless tobacco: Current User    Types: Chew     Comment: history of 1 ppd for 7-8 years. Currently using nicotine gum to assist stopping chewing tobacco.  . Alcohol use 8.4 oz/week    14 Cans of beer per week  . Drug use: No  . Sexual activity: Not Asked   Other Topics Concern  . 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  All other ROS are negative.  BP 110/72   Pulse 81   Temp 98.4 F (36.9 C) (Oral)   Resp 14   Ht 6\' 1"  (1.854 m)   Wt 196 lb (88.9 kg)   SpO2 98%   BMI 25.86 kg/m   Physical Exam  Constitutional: He is oriented to person, place, and time and well-developed, well-nourished, and in no distress.  HENT:  Head: Normocephalic and atraumatic.  Eyes: Conjunctivae are normal.  Neck: Neck supple.  Cardiovascular: Normal rate, regular rhythm, normal heart sounds and intact distal pulses.   Pulmonary/Chest: Effort normal and breath sounds normal. No respiratory distress. He has no wheezes. He has no rales. He exhibits no tenderness.  Lymphadenopathy:    He has no cervical adenopathy.  Neurological: He is alert and oriented to person, place, and time.  Skin: Skin is warm and dry. No rash noted.  Psychiatric: Affect normal.  Vitals reviewed.  Assessment/Plan: Anxiety and depression Doing well on 20 mg Citalopram. Refill sent in. Follow-up for CPE.  ADD (attention deficit disorder)  Doing very well previously on current regimen. Would like to restart. Rx sent. Follow-up scheduled. Will get UDS at next fill.    Piedad Climes, PA-C

## 2017-02-04 NOTE — Assessment & Plan Note (Signed)
Doing well on 20 mg Citalopram. Refill sent in. Follow-up for CPE.

## 2017-02-04 NOTE — Assessment & Plan Note (Signed)
Doing very well previously on current regimen. Would like to restart. Rx sent. Follow-up scheduled. Will get UDS at next fill.

## 2017-02-04 NOTE — Patient Instructions (Signed)
Please continue medications as directed. I have provided refills. I am glad you are doing well.  We will follow-up in 6 months for a complete physical.

## 2017-02-04 NOTE — Progress Notes (Signed)
Pre visit review using our clinic review tool, if applicable. No additional management support is needed unless otherwise documented below in the visit note. 

## 2017-03-18 ENCOUNTER — Other Ambulatory Visit: Payer: Self-pay | Admitting: Physician Assistant

## 2017-03-18 ENCOUNTER — Encounter: Payer: Self-pay | Admitting: Emergency Medicine

## 2017-03-18 DIAGNOSIS — Z79899 Other long term (current) drug therapy: Secondary | ICD-10-CM

## 2017-03-18 MED ORDER — AMPHETAMINE-DEXTROAMPHETAMINE 20 MG PO TABS: 20.0000 mg | ORAL_TABLET | Freq: Two times a day (BID) | ORAL | 0 refills | Status: DC

## 2017-03-18 NOTE — Telephone Encounter (Signed)
Adderall last refilled 02/04/17 #60 Last CSC:12/07/15 Last UDS:07/12/16 low risk Please advise of refill.

## 2017-03-18 NOTE — Telephone Encounter (Signed)
Pt needs refill on adderall, please call when ready for p/u °

## 2017-03-18 NOTE — Telephone Encounter (Signed)
Rx printed and signed. Ok for patient to pick up. Will need UDS at pickup per last OV note.

## 2017-03-18 NOTE — Addendum Note (Signed)
Addended by: Con Memos on: 03/18/2017 11:51 AM   Modules accepted: Orders

## 2017-03-18 NOTE — Telephone Encounter (Signed)
Patient notified rx is ready at the front desk. Patient aware he is due for UDS. He will check with insurance company to verify if insurance will cover.

## 2017-03-19 ENCOUNTER — Other Ambulatory Visit: Payer: No Typology Code available for payment source

## 2017-03-19 DIAGNOSIS — Z79899 Other long term (current) drug therapy: Secondary | ICD-10-CM

## 2017-03-21 LAB — PAIN MGMT, PROFILE 8 W/CONF, U
6 Acetylmorphine: NEGATIVE ng/mL (ref <10)
AMPHETAMINES: NEGATIVE ng/mL (ref <500)
Alcohol Metabolites: POSITIVE ng/mL — AB (ref <500)
BENZODIAZEPINES: NEGATIVE ng/mL (ref <100)
BUPRENORPHINE, URINE: NEGATIVE ng/mL (ref <5)
CREATININE: 60.9 mg/dL
Cocaine Metabolite: NEGATIVE ng/mL (ref <150)
Ethyl Glucuronide (ETG): 53328 ng/mL — ABNORMAL HIGH (ref <500)
Ethyl Sulfate (ETS): 6666 ng/mL — ABNORMAL HIGH (ref <100)
MDMA: NEGATIVE ng/mL (ref <500)
Marijuana Metabolite: NEGATIVE ng/mL (ref <20)
OXIDANT: NEGATIVE ug/mL (ref <200)
Opiates: NEGATIVE ng/mL (ref <100)
Oxycodone: NEGATIVE ng/mL (ref <100)
PH: 8 (ref 4.5–9.0)

## 2017-03-25 NOTE — Progress Notes (Signed)
Called pt and LM with wife to return call.

## 2017-04-30 ENCOUNTER — Encounter: Payer: Self-pay | Admitting: Emergency Medicine

## 2017-04-30 ENCOUNTER — Other Ambulatory Visit: Payer: Self-pay | Admitting: Physician Assistant

## 2017-04-30 MED ORDER — AMPHETAMINE-DEXTROAMPHETAMINE 20 MG PO TABS: 20.0000 mg | ORAL_TABLET | Freq: Two times a day (BID) | ORAL | 0 refills | Status: DC

## 2017-04-30 NOTE — Telephone Encounter (Signed)
Advised patient that rx for Adderall is ready for pick up. [facility]

## 2017-04-30 NOTE — Telephone Encounter (Signed)
Refill request for Adderall, last refilled on 03/18/17 #60 Last OV: 02/04/17 CSC: 12/07/15 UDS: 07/09/16 low risk  Please advise

## 2017-04-30 NOTE — Telephone Encounter (Signed)
Copied from CRM [phone]. Topic: Quick Communication - See Telephone Encounter >> Apr 30, 2017  1:47 PM Dalton Moss, Dalton Moss wrote: CRM for notification. See Telephone encounter for: 04/30/17. Patient needs refill on his amphetamine-dextroamphetamine (ADDERALL) 20 MG tablet (Expired). Patient would like to pick it up by tomorrow, please call him when its ready, thanks.

## 2017-04-30 NOTE — Telephone Encounter (Signed)
Rx for Adderall

## 2017-04-30 NOTE — Telephone Encounter (Signed)
Rx refilled. Needs updated CSC at pickup.

## 2017-06-25 ENCOUNTER — Other Ambulatory Visit: Payer: Self-pay | Admitting: Physician Assistant

## 2017-06-25 MED ORDER — AMPHETAMINE-DEXTROAMPHETAMINE 20 MG PO TABS: 20.0000 mg | ORAL_TABLET | Freq: Two times a day (BID) | ORAL | 0 refills | Status: DC

## 2017-06-25 NOTE — Telephone Encounter (Signed)
Refill sent. He needs to be scheduled for CPE as discussed.

## 2017-06-25 NOTE — Telephone Encounter (Signed)
Copied from CRM #[id number]. Topic: [id number] Communication - Rx Refill/Question >> Jun 25, 2017 12:13 PM Percival SpanishKennedy, Cheryl W wrote: Has the patient contacted their pharmacy     no  RX amphetamine-dextroamphetamine (ADDERALL) 20 MG tablet (Expired)   Pick up   Agent: Please be advised that RX refills may take up to 3 business days. We ask that you follow-up with your pharmacy.

## 2017-06-25 NOTE — Telephone Encounter (Signed)
Adderall last refilled on 04/30/17 #60 Last CSC: 04/30/17 Last UDS: 03/19/17 Last OV: 02/04/17 Need 6 month follow up for CPE Please advise

## 2017-06-27 NOTE — Telephone Encounter (Signed)
Advised patient rx was sent to the pharmacy and to make an appointment for CPE in Feb. He is agreeable.

## 2017-08-12 ENCOUNTER — Encounter: Payer: Self-pay | Admitting: Emergency Medicine

## 2017-08-19 ENCOUNTER — Encounter: Payer: Self-pay | Admitting: Physician Assistant

## 2017-08-19 ENCOUNTER — Ambulatory Visit: Payer: No Typology Code available for payment source | Admitting: Physician Assistant

## 2017-08-19 ENCOUNTER — Other Ambulatory Visit: Payer: Self-pay

## 2017-08-19 VITALS — BP 110/70 | HR 81 | Temp 98.4°F | Resp 14 | Ht 73.0 in | Wt 194.0 lb

## 2017-08-19 DIAGNOSIS — F329 Major depressive disorder, single episode, unspecified: Secondary | ICD-10-CM | POA: Diagnosis not present

## 2017-08-19 DIAGNOSIS — F988 Other specified behavioral and emotional disorders with onset usually occurring in childhood and adolescence: Secondary | ICD-10-CM | POA: Diagnosis not present

## 2017-08-19 DIAGNOSIS — F419 Anxiety disorder, unspecified: Secondary | ICD-10-CM

## 2017-08-19 MED ORDER — AMPHETAMINE-DEXTROAMPHETAMINE 20 MG PO TABS: 20.0000 mg | ORAL_TABLET | Freq: Two times a day (BID) | ORAL | 0 refills | Status: DC

## 2017-08-19 MED ORDER — CITALOPRAM HYDROBROMIDE 10 MG PO TABS: 10.0000 mg | ORAL_TABLET | Freq: Every day | ORAL | 1 refills | Status: DC

## 2017-08-19 NOTE — Patient Instructions (Signed)
Please continue the Adderall as directed. I have provided a new script for you.   Please restart the citalopram daily.  Follow-up in 4 weeks for a complete physical. We will follow-up on anxiety at that time.

## 2017-08-19 NOTE — Assessment & Plan Note (Signed)
Restart Citalopram at 10 mg daily. Follow-up 4 weeks.

## 2017-08-19 NOTE — Progress Notes (Signed)
Patient presents to clinic today for follow-up of ADD and Anxiety/depression.  Patient currently on a regimen of Adderall  20 mg BID. Is taking as directed without side effect. Has done very well with this regimen.  In regards to anxiety/depression, patient was previously on a regimen of Citalopram 20 mg daily. Had weaned himself off after mood improved with job change. Has started noting increased anxiety/irritability and worry. Denies depressed mood or anhedonia. Denies SI/HI. States wife has noted a major difference in mood. Would like to discuss starting back on medication.   Past Medical History:  Diagnosis Date  . Anxiety   . Chicken pox   . History of tinnitus    & hearing loss- Followed by ENT-Dr. Annalee Genta  . Positional vertigo     Current Outpatient Medications on File Prior to Visit  Medication Sig Dispense Refill  . aspirin 81 MG tablet Take 81 mg by mouth daily.    . [DISCONTINUED] zolpidem (AMBIEN) 5 MG tablet Take 1 tablet (5 mg total) by mouth at bedtime as needed for sleep. 30 tablet 3   No current facility-administered medications on file prior to visit.     No Known Allergies  Family History  Problem Relation Age of Onset  . Heart disease Neg Hx   . Stroke Neg Hx   . Diabetes Neg Hx   . Hypertension Neg Hx   . Breast cancer Neg Hx   . Colon cancer Neg Hx   . Prostate cancer Neg Hx     Social History   Socioeconomic History  . Marital status: Married    Spouse name: None  . Number of children: None  . Years of education: None  . Highest education level: None  Social Needs  . Financial resource strain: None  . Food insecurity - worry: None  . Food insecurity - inability: None  . Transportation needs - medical: None  . Transportation needs - non-medical: None  Occupational History  . None  Tobacco Use  . Smoking status: Former Games developer  . Smokeless tobacco: Current User    Types: Chew  . Tobacco comment: history of 1 ppd for 7-8 years. Currently  using nicotine gum to assist stopping chewing tobacco.  Substance and Sexual Activity  . Alcohol use: Yes    Alcohol/week: 8.4 oz    Types: 14 Cans of beer per week  . Drug use: No  . Sexual activity: None  Other Topics Concern  . 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  All other ROS are negative.  BP 110/70   Pulse 81   Temp 98.4 F (36.9 C) (Oral)   Resp 14   Ht 6\' 1"  (1.854 m)   Wt 194 lb (88 kg)   SpO2 98%   BMI 25.60 kg/m   Physical Exam  Constitutional: He is oriented to person, place, and time and well-developed, well-nourished, and in no distress.  HENT:  Head: Normocephalic and atraumatic.  Eyes: Conjunctivae are normal.  Cardiovascular: Normal rate, regular rhythm, normal heart sounds and intact distal pulses.  Pulmonary/Chest: Effort normal and breath sounds normal. No respiratory distress. He has no wheezes. He has no rales. He exhibits no tenderness.  Neurological: He is alert and oriented to person, place, and time.  Skin: Skin is warm and dry. No rash noted.   Assessment/Plan: Attention  deficit disorder Doing well. Continue current regimen. Medication refilled. Will update CSC at CPE next month.   Anxiety and depression Restart Citalopram at 10 mg daily. Follow-up 4 weeks.    Piedad ClimesWilliam [name] Kedrick Mcnamee, PA-C

## 2017-08-19 NOTE — Assessment & Plan Note (Signed)
Doing well. Continue current regimen. Medication refilled. Will update CSC at CPE next month.

## 2017-10-01 ENCOUNTER — Other Ambulatory Visit: Payer: Self-pay | Admitting: Physician Assistant

## 2017-10-01 NOTE — Telephone Encounter (Signed)
Copied from CRM [phone]. Topic: Quick Communication - Rx Refill/Question >> Oct 01, 2017  4:57 PM Floria RavelingStovall, Shana A wrote: Medication:  citalopram (CELEXA) 10 MG tablet [[id number]][[id number]]  amphetamine-dextroamphetamine (ADDERALL) 20 MG tablet [[id number]][[id number]]  Has the patient contacted their pharmacy? No  (Agent: If no, request that the patient contact the pharmacy for the refill.) Preferred Pharmacy (with phone number or street name): [facility] [address]  Agent: Please be advised that RX refills may take up to 3 business days. We ask that you follow-up with your pharmacy.

## 2017-10-02 ENCOUNTER — Other Ambulatory Visit: Payer: Self-pay | Admitting: Physician Assistant

## 2017-10-02 MED ORDER — CITALOPRAM HYDROBROMIDE 10 MG PO TABS: 10.0000 mg | ORAL_TABLET | Freq: Every day | ORAL | 0 refills | Status: DC

## 2017-10-02 MED ORDER — AMPHETAMINE-DEXTROAMPHETAMINE 20 MG PO TABS: 20.0000 mg | ORAL_TABLET | Freq: Two times a day (BID) | ORAL | 0 refills | Status: DC

## 2017-10-02 NOTE — Telephone Encounter (Signed)
Last Fill:   Adderall - 08/19/17 #60, 0      Celexa - 08/19/17 #30, 0

## 2017-10-02 NOTE — Telephone Encounter (Signed)
Celexa and Adderall refill request  LOV 08/19/17 with Marcelline MatesWilliam Martin  [id number] [place] - [address]

## 2017-10-27 ENCOUNTER — Emergency Department (HOSPITAL_BASED_OUTPATIENT_CLINIC_OR_DEPARTMENT_OTHER)
Admission: EM | Admit: 2017-10-27 | Discharge: 2017-10-28 | Disposition: A | Discharge status: Home / Self Care | Payer: No Typology Code available for payment source | Attending: Emergency Medicine | Admitting: Emergency Medicine

## 2017-10-27 ENCOUNTER — Encounter (HOSPITAL_BASED_OUTPATIENT_CLINIC_OR_DEPARTMENT_OTHER): Payer: Self-pay | Admitting: *Deleted

## 2017-10-27 ENCOUNTER — Other Ambulatory Visit: Payer: Self-pay

## 2017-10-27 DIAGNOSIS — Z7982 Long term (current) use of aspirin: Secondary | ICD-10-CM | POA: Diagnosis not present

## 2017-10-27 DIAGNOSIS — Z79899 Other long term (current) drug therapy: Secondary | ICD-10-CM | POA: Insufficient documentation

## 2017-10-27 DIAGNOSIS — Z87891 Personal history of nicotine dependence: Secondary | ICD-10-CM | POA: Diagnosis not present

## 2017-10-27 DIAGNOSIS — H5713 Ocular pain, bilateral: Secondary | ICD-10-CM | POA: Diagnosis not present

## 2017-10-27 MED ORDER — GENTAMICIN SULFATE 0.3 % OP SOLN: 1.0000 [drp] | OPHTHALMIC | 0 refills | Status: DC

## 2017-10-27 MED ORDER — TETRACAINE HCL 0.5 % OP SOLN
OPHTHALMIC | Status: DC
Start: 2017-10-27 — End: 2017-10-28
  Filled 2017-10-27: qty 4

## 2017-10-27 MED ORDER — FLUORESCEIN SODIUM 1 MG OP STRP
1.0000 | ORAL_STRIP | Freq: Once | OPHTHALMIC | Status: AC
  Administered 2017-10-27: 1 via OPHTHALMIC

## 2017-10-27 MED ORDER — TETRACAINE HCL 0.5 % OP SOLN
2.0000 [drp] | Freq: Once | OPHTHALMIC | Status: AC
  Administered 2017-10-27: 2 [drp] via OPHTHALMIC

## 2017-10-27 MED ORDER — FLUORESCEIN SODIUM 1 MG OP STRP
ORAL_STRIP | OPHTHALMIC | Status: AC
  Filled 2017-10-27: qty 1

## 2017-10-27 NOTE — ED Notes (Signed)
Patient unable to tolerate at this time.

## 2017-10-27 NOTE — ED Triage Notes (Signed)
Right eye pain, redness, watering after cleaning grill 2 hours pta. States he may have gotten debris in his eye

## 2017-10-27 NOTE — Discharge Instructions (Addendum)
Contact a health care provider if: °You continue to have eye pain and other symptoms for more than 2 days. °You develop new symptoms, such as redness, tearing, or discharge. °You have discharge that makes your eyelids stick together in the morning. °Your eye patch becomes so loose that you can blink your eye. °Symptoms return after the original abrasion has healed. °Get help right away if: °You have severe eye pain that does not get better with medicine. °You have vision loss. °

## 2017-10-27 NOTE — ED Notes (Signed)
Visual acuity deferred due to pain

## 2017-10-27 NOTE — ED Provider Notes (Signed)
[facility] EMERGENCY DEPARTMENT Provider Note   CSN: [id number] Arrival date & time: 10/27/17  2033     History   Chief Complaint Chief Complaint  Patient presents with  . Eye Pain    HPI Dalton Moss is a 52 y.o. male.  Who presents the emergency department who complains of eye pain and photophobia.  Patient states that he was scrubbing his grill with a metal brush when he felt something go into his left eye.  He complains of bilateral eye pain, photophobia, watering.  This happened earlier today.  He flushed his eyes thoroughly.  He states that he feels like there is a piece of something stuck in the left lateral corner of his left eye.  He denies changes in vision, nausea, vomiting  HPI  Past Medical History:  Diagnosis Date  . Anxiety   . Chicken pox   . History of tinnitus    & hearing loss- Followed by ENT-Dr. Annalee Genta  . Positional vertigo     Patient Active Problem List   Diagnosis Date Noted  . Anxiety and depression 02/04/2017  . Medial epicondylitis of elbow, left 09/03/2016  . Attention deficit disorder 10/31/2015  . Visit for preventive health examination 09/14/2015  . Prostate cancer screening 09/14/2015  . Elevated LDL cholesterol level 11/27/2013  . Low testosterone 11/21/2011  . Soft tissue mass 10/21/2011  . NOISE-INDUCED HEARING LOSS 07/09/2007    Past Surgical History:  Procedure Laterality Date  . APPENDECTOMY  1999  . BACK SURGERY  1995   x 2  . TONSILLECTOMY  1973        Home Medications    Prior to Admission medications   Medication Sig Start Date End Date Taking? Authorizing Provider  amphetamine-dextroamphetamine (ADDERALL) 20 MG tablet Take 1 tablet (20 mg total) by mouth 2 (two) times daily. Patient taking differently: Take 10 mg by mouth 2 (two) times daily.  10/02/17 11/01/17 Yes Waldon Merl, PA-C  aspirin 81 MG tablet Take 81 mg by mouth daily.   Yes [provider]  citalopram (CELEXA) 10 MG  tablet Take 1 tablet (10 mg total) by mouth daily. 10/02/17  Yes Waldon Merl, PA-C  zolpidem (AMBIEN) 5 MG tablet Take 1 tablet (5 mg total) by mouth at bedtime as needed for sleep. 03/13/12 11/27/13  Edwyna Perfect, MD    Family History Family History  Problem Relation Age of Onset  . Heart disease Neg Hx   . Stroke Neg Hx   . Diabetes Neg Hx   . Hypertension Neg Hx   . Breast cancer Neg Hx   . Colon cancer Neg Hx   . Prostate cancer Neg Hx     Social History Social History   Tobacco Use  . Smoking status: Former Games developer  . Smokeless tobacco: Former Neurosurgeon    Types: Chew  . Tobacco comment: history of 1 ppd for 7-8 years. Currently using nicotine gum to assist stopping chewing tobacco.  Substance Use Topics  . Alcohol use: Yes    Alcohol/week: 8.4 oz    Types: 14 Cans of beer per week  . Drug use: No     Allergies   Patient has no known allergies.   Review of Systems Review of Systems Ten systems reviewed and are negative for acute change, except as noted in the HPI.    Physical Exam Updated Vital Signs BP 118/80 (BP Location: Left Arm)   Pulse 77   Temp 98.6 F (  37 C) (Oral)   Resp 16   Ht  (1.88 m)   Wt 83.9 kg (185 lb)   SpO2 97%   BMI 23.75 kg/m   Physical Exam  Constitutional: He appears well-developed and well-nourished. No distress.  HENT:  Head: Normocephalic and atraumatic.  Eyes: Pupils are equal, round, and reactive to light. EOM and lids are normal. Lids are everted and swept, no foreign bodies found. Right eye exhibits no chemosis, no discharge and no exudate. No foreign body present in the right eye. Left eye exhibits no chemosis, no discharge and no exudate. No foreign body present in the left eye. Right conjunctiva is injected. Left conjunctiva is injected. No scleral icterus.  Slit lamp exam:      The right eye shows no corneal abrasion, no corneal flare, no corneal ulcer, no foreign body, no hyphema and no fluorescein uptake.        The left eye shows no corneal abrasion, no corneal flare, no corneal ulcer, no foreign body, no hyphema and no fluorescein uptake.  Neck: Normal range of motion. Neck supple.  Cardiovascular: Normal rate, regular rhythm and normal heart sounds.  Pulmonary/Chest: Effort normal and breath sounds normal. No respiratory distress.  Abdominal: Soft. There is no tenderness.  Musculoskeletal: He exhibits no edema.  Neurological: He is alert.  Skin: Skin is warm and dry. He is not diaphoretic.  Psychiatric: His behavior is normal.  Nursing note and vitals reviewed.    ED Treatments / Results  Labs (all labs ordered are listed, but only abnormal results are displayed) Labs Reviewed - No data to display  EKG None  Radiology No results found.  Procedures Procedures (including critical care time)  Medications Ordered in ED Medications  fluorescein 1 MG ophthalmic strip (has no administration in time range)  tetracaine (PONTOCAINE) 0.5 % ophthalmic solution (has no administration in time range)  fluorescein ophthalmic strip 1 strip (1 strip Both Eyes Given 10/27/17 2217)  tetracaine (PONTOCAINE) 0.5 % ophthalmic solution 2 drop (2 drops Both Eyes Given 10/27/17 2217)     Initial Impression / Assessment and Plan / ED Course  I have reviewed the triage vital signs and the nursing notes.  Pertinent labs & imaging results that were available during my care of the patient were reviewed by me and considered in my medical decision making (see chart for details).     Patient with immediate relief of pain and photophobia with tetracaine.  Unable to visualize any fluorescein uptake, no embedded metal flake or other foreign bodies noted. Pressures 13 and 15  OD/OS. Patient will follow up tomorrow with Dr. Genia Del.  I discussed the case with Dr. Genia Del on the phone today.  Will discharge with Garamycin eyedrops.  He appears appropriate for discharge at this time  Final Clinical Impressions(s) / ED  Diagnoses   Final diagnoses:  None    ED Discharge Orders    None       Arthor Captain, PA-C 10/27/17 2331    Benjiman Core, MD 10/27/17 2358

## 2017-11-14 ENCOUNTER — Other Ambulatory Visit: Payer: Self-pay | Admitting: Physician Assistant

## 2017-11-14 NOTE — Telephone Encounter (Signed)
Copied from CRM [id number]. Topic: Quick Communication - Rx Refill/Question >> Nov 14, 2017  9:36 AM Gerrianne Scale wrote: Medication: amphetamine-dextroamphetamine (ADDERALL) 20 MG tablet (Expired)  Has the patient contacted their pharmacy? Yes.   (Agent: If no, request that the patient contact the pharmacy for the refill.) (Agent: If yes, when and what did the pharmacy advise?)  Preferred Pharmacy (with phone number or street name): [id number] - [place], [place] - [address] AT [address] [phone] (Phone) [phone] (Fax)      Agent: Please be advised that RX refills may take up to 3 business days. We ask that you follow-up with your pharmacy.

## 2017-11-14 NOTE — Telephone Encounter (Signed)
Pt. Request refill  On Adderall  .    /// LOV:  08/19/17   With Marcelline Mates    Last refill 4/17/ 19 # 60

## 2017-11-14 NOTE — Telephone Encounter (Signed)
Ok to transfer care.    Copied from CRM [id number]. Topic: General - Other >> Nov 14, 2017 12:22 PM Eston Mould B wrote: Reason for CRM: Pt would like to transfer pcp  from [place] to [place]   because it is so closer for him  >> Nov 14, 2017  1:10 PM Dillard, Delano Metz, CMA wrote: Routing to PCP for approval for transfer

## 2017-11-15 MED ORDER — AMPHETAMINE-DEXTROAMPHETAMINE 20 MG PO TABS: 20.0000 mg | ORAL_TABLET | Freq: Two times a day (BID) | ORAL | 0 refills | Status: DC

## 2017-11-15 NOTE — Addendum Note (Signed)
Addended by: Waldon Merl on: 11/15/2017 08:41 AM   Modules accepted: Orders

## 2017-11-15 NOTE — Telephone Encounter (Signed)
Patient wants to transfer to [place] [facility], Former PCP has approved this request.

## 2017-11-15 NOTE — Telephone Encounter (Signed)
I have sent in a refill for him until he transfers care.

## 2017-11-15 NOTE — Telephone Encounter (Signed)
Can we do a refill before the transfer?

## 2017-11-22 ENCOUNTER — Telehealth: Payer: Self-pay | Admitting: Physician Assistant

## 2017-11-22 NOTE — Telephone Encounter (Signed)
I called and left message on patient voicemail, approval to transfer to [facility] was approved. Left message with office phone number to call and schedule appointment.  *PEC - ok to schedule transfer of care when patient returns call.

## 2017-11-22 NOTE — Telephone Encounter (Signed)
I called and left message on voicemail, approval to transfer has been approval. Office phone number left to call office and schedule.   *PEC - ok to schedule transfer of care, when patient returns call to schedule.

## 2017-12-26 ENCOUNTER — Other Ambulatory Visit: Payer: Self-pay | Admitting: Physician Assistant

## 2017-12-26 NOTE — Telephone Encounter (Signed)
Copied from CRM [phone]. Topic: Quick Communication - Rx Refill/Question >> Dec 26, 2017  4:00 PM Zada GirtLander, WashingtonLumin L wrote: Medication: amphetamine-dextroamphetamine (ADDERALL) 20 MG tablet (going out of town Monday 07/15)  Has the patient contacted their pharmacy? Yes.   (Agent: If no, request that the patient contact the pharmacy for the refill.) (Agent: If yes, when and what did the pharmacy advise?)  Preferred Pharmacy (with phone number or street name): [phone] - [place], [place] - [address] AT [address] & [address] [place] [id number] Phone: [phone] Fax: [phone]  Agent: Please be advised that RX refills may take up to 3 business days. We ask that you follow-up with your pharmacy.

## 2017-12-26 NOTE — Telephone Encounter (Signed)
Last Filled: 11/15/2017 #60, 0 Last OV: 08/19/2017

## 2017-12-27 MED ORDER — AMPHETAMINE-DEXTROAMPHETAMINE 20 MG PO TABS: 20.0000 mg | ORAL_TABLET | Freq: Two times a day (BID) | ORAL | 0 refills | Status: DC

## 2018-02-10 ENCOUNTER — Other Ambulatory Visit: Payer: Self-pay | Admitting: Physician Assistant

## 2018-02-10 MED ORDER — AMPHETAMINE-DEXTROAMPHETAMINE 20 MG PO TABS: 20.0000 mg | ORAL_TABLET | Freq: Two times a day (BID) | ORAL | 0 refills | Status: DC

## 2018-02-10 NOTE — Telephone Encounter (Signed)
adderall refill Last Refill: 12/27/17 # 60 Last OV:  08/19/17 PCP: Marcelline MatesWilliam Martin [facility] [address] [place]

## 2018-02-10 NOTE — Telephone Encounter (Signed)
Copied from CRM [phone]. Topic: Quick Communication - Rx Refill/Question >> Feb 10, 2018 12:55 PM Floria RavelingStovall, Shana A wrote: Medication: amphetamine-dextroamphetamine (ADDERALL) 20 MG tablet [[id number]][[id number]]  ENDED / we made pt appt for med fu .  [place] and will back in town the 1st week of sept.  We made an appt for 9/6  Has the patient contacted their pharmacy? No  (Agent: If no, request that the patient contact the pharmacy for the refill.) (Agent: If yes, when and what did the pharmacy advise?)  Preferred Pharm[facility] (with phone number or street name[id number] - [place], [place] - [address] AT [address] [phone] (Phone)   Agent: Please be advised that RX refills may take up to 3 business days. We ask that you follow-up with your pharmacy.

## 2018-02-21 ENCOUNTER — Ambulatory Visit: Payer: Self-pay | Admitting: Physician Assistant

## 2018-02-25 ENCOUNTER — Other Ambulatory Visit: Payer: Self-pay

## 2018-02-25 ENCOUNTER — Ambulatory Visit (INDEPENDENT_AMBULATORY_CARE_PROVIDER_SITE_OTHER): Payer: No Typology Code available for payment source | Admitting: Physician Assistant

## 2018-02-25 ENCOUNTER — Encounter: Payer: Self-pay | Admitting: Physician Assistant

## 2018-02-25 VITALS — BP 122/83 | HR 78 | Temp 98.0°F | Resp 16 | Ht 73.0 in | Wt 190.0 lb

## 2018-02-25 DIAGNOSIS — Z1211 Encounter for screening for malignant neoplasm of colon: Secondary | ICD-10-CM | POA: Diagnosis not present

## 2018-02-25 DIAGNOSIS — F988 Other specified behavioral and emotional disorders with onset usually occurring in childhood and adolescence: Secondary | ICD-10-CM | POA: Diagnosis not present

## 2018-02-25 DIAGNOSIS — F329 Major depressive disorder, single episode, unspecified: Secondary | ICD-10-CM

## 2018-02-25 DIAGNOSIS — F419 Anxiety disorder, unspecified: Secondary | ICD-10-CM

## 2018-02-25 DIAGNOSIS — F32A Depression, unspecified: Secondary | ICD-10-CM

## 2018-02-25 NOTE — Assessment & Plan Note (Signed)
Doing very well off of medication. Will remain off for now. He will monitor symptoms and let us know if anything is deteriorating.

## 2018-02-25 NOTE — Assessment & Plan Note (Signed)
Doing very well. Will continue current regimen. CSC updated today (annually). UDS up-to-date.

## 2018-02-25 NOTE — Patient Instructions (Signed)
Continue current regimen. I am glad you are doing so well.  You are overdue for a complete physical.  I know you are declining today due to time constraints. Please check with insurance on coverage for preventive services and schedule CPE. Also let me know who they are requiring you to use for colonoscopy. This is overdue.

## 2018-02-25 NOTE — Assessment & Plan Note (Signed)
Overdue for repeat colonoscopy. Due in 2017. Last in 2012 with 5-year recall. He is to check with insurance to see where they will cover it, call us so we can set this up ASAP.

## 2018-02-25 NOTE — Progress Notes (Signed)
Patient presents to clinic today for follow-up of ADD and anxiety/depression. Patient is currently on a regimen of Adderall 20 mg BID for ADD. Is taking as directed and has done very well with this regimen. Denies anorexia or increased heart rate/anxiety with medication. Mood has been doing very well even with him stopping his Citalopram 3 months ago. Is keeping active. .   Past Medical History:  Diagnosis Date  . Anxiety   . Chicken pox   . History of tinnitus    & hearing loss- Followed by ENT-Dr. Annalee Genta  . Positional vertigo     Current Outpatient Medications on File Prior to Visit  Medication Sig Dispense Refill  . amphetamine-dextroamphetamine (ADDERALL) 20 MG tablet Take 1 tablet (20 mg total) by mouth 2 (two) times daily. 60 tablet 0  . aspirin 81 MG tablet Take 81 mg by mouth daily.    . Multiple Vitamins-Minerals (MULTIVITAMIN ADULTS PO) Take by mouth.    . [DISCONTINUED] zolpidem (AMBIEN) 5 MG tablet Take 1 tablet (5 mg total) by mouth at bedtime as needed for sleep. 30 tablet 3   No current facility-administered medications on file prior to visit.     No Known Allergies  Family History  Problem Relation Age of Onset  . Heart disease Neg Hx   . Stroke Neg Hx   . Diabetes Neg Hx   . Hypertension Neg Hx   . Breast cancer Neg Hx   . Colon cancer Neg Hx   . Prostate cancer Neg Hx     Social History   Socioeconomic History  . Marital status: Married    Spouse name: Not on file  . Number of children: Not on file  . Years of education: Not on file  . Highest education level: Not on file  Occupational History  . Not on file  Social Needs  . Financial resource strain: Not on file  . Food insecurity:    Worry: Not on file    Inability: Not on file  . Transportation needs:    Medical: Not on file    Non-medical: Not on file  Tobacco Use  . Smoking status: Former Games developer  . Smokeless tobacco: Former Neurosurgeon    Types: Chew  . Tobacco comment: history of 1 ppd  for 7-8 years. Currently using nicotine gum to assist stopping chewing tobacco.  Substance and Sexual Activity  . Alcohol use: Yes    Alcohol/week: 14.0 standard drinks    Types: 14 Cans of beer per week  . Drug use: No  . Sexual activity: Not on file  Lifestyle  . Physical activity:    Days per week: Not on file    Minutes per session: Not on file  . Stress: Not on file  Relationships  . Social connections:    Talks on phone: Not on file    Gets together: Not on file    Attends religious service: Not on file    Active member of club or organization: Not on file    Attends meetings of clubs or organizations: Not on file    Relationship status: Not on file  Other Topics Concern  . 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  All other ROS are negative.  BP 122/83   Pulse 78   Temp 98 F (36.7 C) (Oral)  Resp 16   Ht 6\' 1"  (1.854 m)   Wt 190 lb (86.2 kg)   SpO2 97%   BMI 25.07 kg/m   Physical Exam  Constitutional: He is oriented to person, place, and time. He appears well-developed and well-nourished.  HENT:  Head: Normocephalic and atraumatic.  Neck: Neck supple.  Cardiovascular: Normal rate, regular rhythm, normal heart sounds and intact distal pulses.  Pulmonary/Chest: Effort normal.  Lymphadenopathy:    He has no cervical adenopathy.  Neurological: He is alert and oriented to person, place, and time.  Psychiatric: He has a normal mood and affect.  Vitals reviewed.  Assessment/Plan: Anxiety and depression Doing very well off of medication. Will remain off for now. He will monitor symptoms and let us know if anything is deteriorating.   Attention deficit disorder Doing very well. Will continue current regimen. CSC updated today (annually). UDS up-to-date.   Colon cancer screening Overdue for repeat colonoscopy. Due in 2017. Last in 2012 with 5-year recall.  He is to check with insurance to see where they will cover it, call us so we can set this up ASAP.     Piedad Climes, PA-C

## 2018-03-21 ENCOUNTER — Other Ambulatory Visit: Payer: Self-pay | Admitting: Physician Assistant

## 2018-03-21 NOTE — Telephone Encounter (Signed)
Patient called and asked is he still taking Adderall, he says he is taking it BID and is no longer on Celexa. He asks if the refill can be done because he is going out of town to work on Monday and will need the medication, he only has 1 week left.

## 2018-03-21 NOTE — Telephone Encounter (Signed)
Requested medication (s) are due for refill today: Yes  Requested medication (s) are on the active medication list: Yes  Last refill: 02/10/18  Future visit scheduled: No  Notes to clinic:  Patient going out of town to work on Monday and has 1 week left of medication.     Requested Prescriptions  Pending Prescriptions Disp Refills   amphetamine-dextroamphetamine (ADDERALL) 20 MG tablet 60 tablet 0    Sig: Take 1 tablet (20 mg total) by mouth 2 (two) times daily.     Not Delegated - Psychiatry:  Stimulants/ADHD Failed - 03/21/2018  5:55 PM      Failed - This refill cannot be delegated      Failed - Urine Drug Screen completed in last 360 days.      Passed - Valid encounter within last 3 months    Recent Outpatient Visits          3 weeks ago Attention deficit disorder, unspecified hyperactivity presence   [facility] [place], [place] C, [place]   7 months ago Anxiety and depression   [facility] [place], [place] C, [place]   1 year ago Attention deficit disorder, unspecified hyperactivity presence   [facility] [place], Kristine Garbe, [place]   1 year ago Attention deficit disorder, unspecified hyperactivity presence   [facility] [place], [place] C, [place]   1 year ago Attention deficit disorder, unspecified hyperactivity presence   [facility] [place], [place] C, [place]

## 2018-03-21 NOTE — Telephone Encounter (Signed)
Copied from CRM #[id number]. Topic: Quick Communication - Rx Refill/Question >> Mar 21, 2018  5:02 PM Mickel Baas B, [place] wrote: Medication: amphetamine-dextroamphetamine (ADDERALL) 20 MG tablet  Has the patient contacted their pharmacy? Yes.   (Agent: If no, request that the patient contact the pharmacy for the refill.) (Agent: If yes, when and what did the pharmacy advise?)  Preferred Pharmacy (with phone number or street name): [id number] - [place], [place] - [address] AT [address] OF [address]  Agent: Please be advised that RX refills may take up to 3 business days. We ask that you follow-up with your pharmacy.

## 2018-03-24 MED ORDER — AMPHETAMINE-DEXTROAMPHETAMINE 20 MG PO TABS: 20.0000 mg | ORAL_TABLET | Freq: Two times a day (BID) | ORAL | 0 refills | Status: DC

## 2018-05-05 ENCOUNTER — Other Ambulatory Visit: Payer: Self-pay | Admitting: Physician Assistant

## 2018-05-05 NOTE — Telephone Encounter (Signed)
Copied from CRM [id number]. Topic: Quick Communication - Rx Refill/Question >> May 05, 2018 10:03 AM Darletta MollLander, [place] L wrote: Medication: amphetamine-dextroamphetamine (ADDERALL) 20 MG tablet   Has the patient contacted their pharmacy? Yes.   (Agent: If no, request that the patient contact the pharmacy for the refill.) (Agent: If yes, when and what did the pharmacy advise?)  Preferred Pharm[facility] (with phone number or street name[id number] [place], [place] - [address] AT [facility] [address] [place] [id number] Phone: [phone] Fax: [phone]  Agent: Please be advised that RX refills may take up to 3 business days. We ask that you follow-up with your pharmacy.

## 2018-05-06 MED ORDER — AMPHETAMINE-DEXTROAMPHETAMINE 20 MG PO TABS: 20.0000 mg | ORAL_TABLET | Freq: Two times a day (BID) | ORAL | 0 refills | Status: DC

## 2018-05-06 NOTE — Telephone Encounter (Signed)
Adderall 03/24/18 #60 Last OV: 02/25/18 CSC: 04/30/17 UDS: 03/19/17  Please advise

## 2018-05-06 NOTE — Telephone Encounter (Signed)
Requested medication (s) are due for refill today: yes  Requested medication (s) are on the active medication list: yes  Last refill:  03/24/18 #60  Future visit scheduled: no  Notes to clinic:  Current prescription expired    Requested Prescriptions  Pending Prescriptions Disp Refills   amphetamine-dextroamphetamine (ADDERALL) 20 MG tablet 60 tablet 0    Sig: Take 1 tablet (20 mg total) by mouth 2 (two) times daily.     Not Delegated - Psychiatry:  Stimulants/ADHD Failed - 05/06/2018  8:49 AM      Failed - This refill cannot be delegated      Failed - Urine Drug Screen completed in last 360 days.      Passed - Valid encounter within last 3 months    Recent Outpatient Visits          2 months ago Attention deficit disorder, unspecified hyperactivity presence   [facility] [place], [place], [place]   8 months ago Anxiety and depression   [facility] [place], [place], [place]   1 year ago Attention deficit disorder, unspecified hyperactivity presence   [facility] [place], [place], [place]   1 year ago Attention deficit disorder, unspecified hyperactivity presence   [facility] [place], [place], [place]   1 year ago Attention deficit disorder, unspecified hyperactivity presence   [facility] [place], [place], [place]

## 2018-07-04 ENCOUNTER — Other Ambulatory Visit: Payer: Self-pay | Admitting: Physician Assistant

## 2018-07-04 MED ORDER — AMPHETAMINE-DEXTROAMPHETAMINE 20 MG PO TABS: 20.0000 mg | ORAL_TABLET | Freq: Two times a day (BID) | ORAL | 0 refills | Status: DC

## 2018-07-04 NOTE — Telephone Encounter (Signed)
Copied from CRM [id number]. Topic: Quick Communication - Rx Refill/Question >> Jul 04, 2018 11:39 AM Jilda Roche wrote: Medication: amphetamine-dextroamphetamine (ADDERALL) 20 MG tablet     Has the patient contacted their pharmacy? No. (Agent: If no, request that the patient contact the pharmacy for the refill.) (Agent: If yes, when and what did the pharmacy advise?)  Preferred Pharmacy (with phone number or street name): [id number] - [place], [place] - [address] AT [facility] [address] [phone] (Phone) [phone] (Fax)    Agent: Please be advised that RX refills may take up to 3 business days. We ask that you follow-up with your pharmacy.

## 2018-07-04 NOTE — Telephone Encounter (Signed)
Requested medication (s) are due for refill today: yes  Requested medication (s) are on the active medication list: yes    Last refill: 05/06/18  #60   0 refills  Future visit scheduled no  Notes to clinic:not delegated  Requested Prescriptions  Pending Prescriptions Disp Refills   amphetamine-dextroamphetamine (ADDERALL) 20 MG tablet 60 tablet 0    Sig: Take 1 tablet (20 mg total) by mouth 2 (two) times daily for 30 days.     Not Delegated - Psychiatry:  Stimulants/ADHD Failed - 07/04/2018 11:47 AM      Failed - This refill cannot be delegated      Failed - Urine Drug Screen completed in last 360 days.      Failed - Valid encounter within last 3 months    Recent Outpatient Visits          4 months ago Attention deficit disorder, unspecified hyperactivity presence   [facility] [place], [place] C, [place]   10 months ago Anxiety and depression   [facility] [place], [place] C, [place]   1 year ago Attention deficit disorder, unspecified hyperactivity presence   [facility] [place], Kristine Garbe, [place]   1 year ago Attention deficit disorder, unspecified hyperactivity presence   [facility] [place], [place] C, [place]   1 year ago Attention deficit disorder, unspecified hyperactivity presence   [facility] [place], [place] C, [place]

## 2018-07-14 DIAGNOSIS — M47816 Spondylosis without myelopathy or radiculopathy, lumbar region: Secondary | ICD-10-CM | POA: Insufficient documentation

## 2018-09-03 ENCOUNTER — Ambulatory Visit: Payer: Self-pay | Admitting: Orthopedic Surgery

## 2018-09-03 NOTE — H&P (Signed)
Subjective:   Dalton Moss is a pleasant 52 year old male with no significant PMH except lumbar spine surgery in the 90s.  Approximately 1.5 years ago patient was moving furniture and began having low back pain and radicular leg pain he had cortisone which was helpful.  In February of this year the patient had low back pain with radicular right leg pain which he describes in his posterior calf, heel, sole of his foot.  He has also has weakness.  He rates his pain as severe and debilitating. He had an [facility] February 13 which was not helpful.  He has had gabapentin which also was not helpful.  No loss of bladder or bowel function.  Patient Active Problem List   Diagnosis Date Noted  . Colon cancer screening 02/25/2018  . Anxiety and depression 02/04/2017  . Attention deficit disorder 10/31/2015  . Visit for preventive health examination 09/14/2015  . Prostate cancer screening 09/14/2015  . Elevated LDL cholesterol level 11/27/2013  . Low testosterone 11/21/2011  . Soft tissue mass 10/21/2011  . 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 Anxiety   . Chicken pox   . History of tinnitus    & hearing loss- Followed by ENT-Dr. Annalee Genta  . Positional vertigo     Past Surgical History:  Procedure Laterality Date  . APPENDECTOMY  1999  . BACK SURGERY  1995   x 2  . TONSILLECTOMY  1973    Current Outpatient Medications  Medication Sig Dispense Refill Last Dose  . amphetamine-dextroamphetamine (ADDERALL) 20 MG tablet Take 1 tablet (20 mg total) by mouth 2 (two) times daily for 30 days. 60 tablet 0   . aspirin 81 MG tablet Take 81 mg by mouth daily.   Taking  . Multiple Vitamins-Minerals (MULTIVITAMIN ADULTS PO) Take by mouth.   Taking   No current facility-administered medications for this visit.    No Known Allergies  Social History   Tobacco Use  . Smoking status: Former Games developer  . Smokeless tobacco: Former Neurosurgeon    Types: Chew  . Tobacco comment:  history of 1 ppd for 7-8 years. Currently using nicotine gum to assist stopping chewing tobacco.  Substance Use Topics  . Alcohol use: Yes    Alcohol/week: 14.0 standard drinks    Types: 14 Cans of beer per week    Family History  Problem Relation Age of Onset  . Heart disease Neg Hx   . Stroke Neg Hx   . Diabetes Neg Hx   . Hypertension Neg Hx   . Breast cancer Neg Hx   . Colon cancer Neg Hx   . Prostate cancer Neg Hx     Review of Systems Constitutional: Constitutional: no significant weight gain or loss and no fever.  HEENT: Eyes: no irritation, dry eyes, vision change, or sore throat.  Cardiovascular: Cardiovascular: no palpitations or chest pain.  Respiratory: Respiratory: no cough or shortness of breath and No COPD.  Gastrointestinal: Gastrointestinal: no vomiting or diarrhea and not vomiting blood.  Genitourinary: Genitourinary: no blood in urine or difficulty urinating.  Musculoskeletal: Musculoskeletal: no swelling in Joints and Joint Pain.  Integumentary: Skin: no rashes or varicose veins.  Neurologic: Neurologic: no seizures, dizziness, or difficulty with balance and numbness.  Endocrine: temperature intolerance (normal) to heat.  Hematologic/Lymphatic: Hematologic/Lymphatic no bruising or swollen glands.  Additionally reports: Reported by patient on 09/03/2018 ROS as noted in the HPI  Objective:   Vitals:  Ht: 6 ft 2 in  Wt: 184 lbs  BMI: 23.6  Pain Scale: 8   General: AAOX3, well developed and well nourished, standing/pacing unable to find comfortable position Ambulation: antalgic, right foot drop; uses no assitive devices Heart: RRR, no rubs, murmers, or gallops. Lungs: CTAB Abdomen: Normal bowel soundsX4, soft, non-tender, no hepatosplenomegaly.  ROM: -Spine: normal ROM, pain elicited with fwd flexion and extension. - Knee: flexion and extension normal and pain free bilaterally. - Ankle: Dorsiflexion, plantarflexion, inversion, eversion  normal and pain free.  Dermatomes: Lower extremity sensation to light touch abnormal with positive S1 dysethesias on right.  Myotomes: - Hip Flexion: Left 5/5, Right 5/5 - Hip Adduction: Left 5/5, Right 5/5 - Knee Extension: Left 5/5, Right 5/5 - Knee Flexion: Left 5/5, Right 5/5 - Ankle Dorsiflextion: Left 5/5, Right 5/5 - Ankle Eversion: Left 5/5, Right 4/5 - Ankle Plantarflexion: Left 5/5, Right 4/5  Reflexes: - Patella: Left2+, Right 2+ - Achilles: Left2+, Right 2+ - Babinski: Left Ngative, Right Negative - Clonus: Negative  Special Tests: - Straight Leg Raise: Left Negative, Right Positive  PV: Extremities warm and well profused. Posterior and dorsalis pedis pulse 2+ bilaterally, No pitting Edema, discoloration, calf tenderness, or palpable cords. Homan's negative bilaterally.  X-Ray impression: No significant scoliosis, although patient is leaning to the side. No fracture noted. There is degenerative disc disease noted at the L4-5 and L5-S1 level. No significant listhesis  MRI: MRI dated 08/24/2018 compared to MRI from 05/16/2017 shows a new large right paracentral extrusion of the L5-S1 level compressing the S1 nerve root with mild mass-effect on the left S1 nerve root.  Assessment:   Patient has significant mild back pain with horrific right radicular leg pain. Clinical imaging demonstrates a large posterior lateral right-sided disc herniation with marked compression of the S1 nerve root. Clinically he has gastrocnemius weakness with 3 out of 5 strength. He also has numbness and dysesthesias in the S1 dermatome, as well as a positive nerve root tension sign.   Plan:   At this point time I had a long discussion about treatment options. To date he has had epidural injection which provided no relief. His principal source of pain and disability is the severe radicular leg pain as well as neurological deficits. I think given the size of this disc herniation, the duration of his  radicular leg pain (approximately 3 months), as well as the severity of his pain and motor deficits he is a good candidate for surgical intervention. Planned surgical procedure be a lumbar microdiscectomy.  I have reviewed the surgical procedure as well as the risks and benefits and all of his questions were addressed .Risks and benefits of surgery were discussed with the patient. These include: Infection, bleeding, death, stroke, paralysis, ongoing or worse pain, need for additional surgery, leak of spinal fluid, adjacent segment degeneration requiring additional surgery, post-operative hematoma formation that can result in neurological compromise and the need for urgent/emergent re-operation. Loss in bowel and bladder control. Injury to major vessels that could result in the need for urgent abdominal surgery to stop bleeding. Risk of deep venous thrombosis (DVT) and the need for additional treatment. Recurrent disc herniation resulting in the need for revision surgery, which could include fusion surgery (utilizing instrumentation such as pedicle screws and intervertebral cages).  Additional risk: If instrumentation is used there is a risk of migration, or breakage of that hardware that could require additional surgery.  Goal of surgery is to reduce the radicular leg pain and improve his motor weakness thereby  improving his quality-of-life.   Treatment plan: I have given him a clearance form to bring to his primary care physician today for evaluation as well as to obtain blood work. Given the fact that we will try and do the surgery urgent basis I will try and expedite the overall approval process.

## 2018-09-04 ENCOUNTER — Ambulatory Visit (INDEPENDENT_AMBULATORY_CARE_PROVIDER_SITE_OTHER): Payer: No Typology Code available for payment source | Admitting: Physician Assistant

## 2018-09-04 ENCOUNTER — Other Ambulatory Visit: Payer: Self-pay

## 2018-09-04 ENCOUNTER — Encounter: Payer: Self-pay | Admitting: Physician Assistant

## 2018-09-04 VITALS — BP 130/98 | HR 89 | Temp 98.0°F | Resp 16 | Wt 190.0 lb

## 2018-09-04 DIAGNOSIS — Z01818 Encounter for other preprocedural examination: Secondary | ICD-10-CM

## 2018-09-04 LAB — COMPREHENSIVE METABOLIC PANEL
ALT: 44 U/L (ref 0–53)
AST: 27 U/L (ref 0–37)
Albumin: 5 g/dL (ref 3.5–5.2)
Alkaline Phosphatase: 50 U/L (ref 39–117)
BILIRUBIN TOTAL: 0.9 mg/dL (ref 0.2–1.2)
BUN: 11 mg/dL (ref 6–23)
CALCIUM: 10.2 mg/dL (ref 8.4–10.5)
CO2: 29 meq/L (ref 19–32)
CREATININE: 0.93 mg/dL (ref 0.40–1.50)
Chloride: 100 mEq/L (ref 96–112)
GFR: 85.39 mL/min (ref >60.00)
Glucose, Bld: 109 mg/dL — ABNORMAL HIGH (ref 70–99)
Potassium: 4.4 mEq/L (ref 3.5–5.1)
SODIUM: 137 meq/L (ref 135–145)
Total Protein: 7.4 g/dL (ref 6.0–8.3)

## 2018-09-04 LAB — CBC WITH DIFFERENTIAL/PLATELET
BASOS ABS: 0 10*3/uL (ref 0.0–0.1)
Basophils Relative: 0.4 % (ref 0.0–3.0)
EOS ABS: 0.1 10*3/uL (ref 0.0–0.7)
Eosinophils Relative: 1.5 % (ref 0.0–5.0)
HEMATOCRIT: 43.2 % (ref 39.0–52.0)
HEMOGLOBIN: 15.2 g/dL (ref 13.0–17.0)
LYMPHS PCT: 40.1 % (ref 12.0–46.0)
Lymphs Abs: 1.9 10*3/uL (ref 0.7–4.0)
MCHC: 35.2 g/dL (ref 30.0–36.0)
MCV: 91.9 fl (ref 78.0–100.0)
Monocytes Absolute: 0.4 10*3/uL (ref 0.1–1.0)
Monocytes Relative: 8.2 % (ref 3.0–12.0)
NEUTROS ABS: 2.3 10*3/uL (ref 1.4–7.7)
Neutrophils Relative %: 49.8 % (ref 43.0–77.0)
PLATELETS: 245 10*3/uL (ref 150.0–400.0)
RBC: 4.7 Mil/uL (ref 4.22–5.81)
RDW: 12.6 % (ref 11.5–15.5)
WBC: 4.7 10*3/uL (ref 4.0–10.5)

## 2018-09-04 LAB — PROTIME-INR
INR: 1 ratio (ref 0.8–1.0)
Prothrombin Time: 11.5 s (ref 9.6–13.1)

## 2018-09-04 LAB — APTT: aPTT: 35.6 s — ABNORMAL HIGH (ref 23.4–32.7)

## 2018-09-04 NOTE — Progress Notes (Signed)
Subjective:    Dalton Moss is a 53 y.o. male who presents to the office today for a preoperative consultation at the request of surgeon Dr. Shon Baton who plans on performing L5/S1 discectomy ASAP -- hopefully 1st thing next week. Planned anesthesia: general. Patients bleeding risk: no recent abnormal bleeding and no remote history of abnormal bleeding.   The following portions of the patient's history were reviewed and updated as appropriate: allergies, current medications, past family history, past medical history, past social history, past surgical history and problem list.  Review of Systems Pertinent items are noted in HPI.    Objective:    BP (!) 130/98   Pulse 89   Temp 98 F (36.7 C) (Oral)   Resp 16   Wt 190 lb (86.2 kg)   SpO2 98%   BMI 25.07 kg/m  General appearance: alert, cooperative, appears stated age and no distress Head: Normocephalic, without obvious abnormality, atraumatic Ears: normal TM's and external ear canals both ears Nose: Nares normal. Septum midline. Mucosa normal. No drainage or sinus tenderness. Throat: lips, mucosa, and tongue normal; teeth and gums normal Lungs: clear to auscultation bilaterally Heart: regular rate and rhythm, S1, S2 normal, no murmur, click, rub or gallop Extremities: extremities normal, atraumatic, no cyanosis or edema Pulses: 2+ and symmetric Skin: Skin color, texture, turgor normal. No rashes or lesions Neurologic: Alert and oriented X 3, normal strength and tone. Normal symmetric reflexes. Normal coordination and gait  Predictors of intubation difficulty:  Morbid obesity? no  Anatomically abnormal facies? no  Prominent incisors? no  Receding mandible? no  Short, thick neck? no  Neck range of motion: normal  Dentition: No chipped, loose, or missing teeth.  Cardiographics ECG: normal sinus rhythm, no blocks or conduction defects, no ischemic changes  Lab Review  not applicable  - will be obtaining today.   Assessment:       53 y.o. male with planned surgery as above.   Known risk factors for perioperative complications: None alcohol use but has been minimized recently.   Difficulty with intubation is not anticipated.  Current medications which may produce withdrawal symptoms if withheld perioperatively: none     Plan:    1. Preoperative workup as follows chest x-ray (completed by surgeon), ECG (NSR), hemoglobin, hematocrit, electrolytes, creatinine, glucose, liver function studies, coagulation studies. 2. Change in medication regimen before surgery: discontinue ASA 14 days before surgery. 3. Prophylaxis for cardiac events with perioperative beta-blockers: not indicated.

## 2018-09-04 NOTE — Patient Instructions (Signed)
Please go to the lab today for blood work.  I will call you with your results. We will alter treatment regimen(s) if indicated by your results.   I will send over clearance ASAP. EKG looks good so just waiting on lab results!  Stay off of the baby Aspirin.

## 2018-09-05 ENCOUNTER — Ambulatory Visit: Payer: Self-pay | Admitting: Orthopedic Surgery

## 2018-09-05 NOTE — Pre-Procedure Instructions (Signed)
[facility]  09/05/2018      [id number] [place], [place] - [address] AT [facility] OF [address] [place] [address] Phone: [phone] Fax: [phone]    Your procedure is scheduled on September 10, 2018.  Report to [facility] Entrance "A" at 945 AM.  Call this number if you have problems the morning of surgery:  [phone]   Remember:  Do not eat or drink after midnight.    Take these medicines the morning of surgery with A SIP OF WATER  Tylenol-if needed  Do NOT take ADDERALL the day of surgery.  Follow your surgeon's instructions on when to hold/resume aspirin.  If no instructions were given call the office to determine how they would like to you take aspirin  Beginning now, STOP taking any Aleve, Naproxen, Ibuprofen, Motrin, Advil, Goody's, BC's, all herbal medications, fish oil, and all vitamins    Do not wear jewelry  Do not wear lotions, powders, or colognes, or deodorant.  Men may shave face and neck.  Do not bring valuables to the hospital.  [facility] is not responsible for any belongings or valuables.  Contacts, dentures or bridgework may not be worn into surgery.  Leave your suitcase in the car.  After surgery it may be brought to your room.  For patients admitted to the hospital, discharge time will be determined by your treatment team.  Patients discharged the day of surgery will not be allowed to drive home.    [place]- Preparing For Surgery  Before surgery, you can play an important role. Because skin is not sterile, your skin needs to be as free of germs as possible. You can reduce the number of germs on your skin by washing with CHG (chlorahexidine gluconate) Soap before surgery.  CHG is an antiseptic cleaner which kills germs and bonds with the skin to continue killing germs even after washing.    Oral Hygiene is also important to reduce your risk of infection.  Remember - BRUSH YOUR TEETH  THE MORNING OF SURGERY WITH YOUR REGULAR TOOTHPASTE  Please do not use if you have an allergy to CHG or antibacterial soaps. If your skin becomes reddened/irritated stop using the CHG.  Do not shave (including legs and underarms) for at least 48 hours prior to first CHG shower. It is OK to shave your face.  Please follow these instructions carefully.   1. Shower the NIGHT BEFORE SURGERY and the MORNING OF SURGERY with CHG.   2. If you chose to wash your hair, wash your hair first as usual with your normal shampoo.  3. After you shampoo, rinse your hair and body thoroughly to remove the shampoo.  4. Use CHG as you would any other liquid soap. You can apply CHG directly to the skin and wash gently with a scrungie or a clean washcloth.   5. Apply the CHG Soap to your body ONLY FROM THE NECK DOWN.  Do not use on open wounds or open sores. Avoid contact with your eyes, ears, mouth and genitals (private parts). Wash Face and genitals (private parts)  with your normal soap.  6. Wash thoroughly, paying special attention to the area where your surgery will be performed.  7. Thoroughly rinse your body with warm water from the neck down.  8. DO NOT shower/wash with your normal soap after using and rinsing off the CHG Soap.  9. Pat yourself dry  with a CLEAN TOWEL.  10. Wear CLEAN PAJAMAS to bed the night before surgery, wear comfortable clothes the morning of surgery  11. Place CLEAN SHEETS on your bed the night of your first shower and DO NOT SLEEP WITH PETS.  Day of Surgery:  Do not apply any deodorants/lotions.  Please wear clean clothes to the hospital/surgery center.   Remember to brush your teeth WITH YOUR REGULAR TOOTHPASTE.  Please read over the following fact sheets that you were given.

## 2018-09-08 ENCOUNTER — Other Ambulatory Visit: Payer: Self-pay

## 2018-09-08 ENCOUNTER — Encounter (HOSPITAL_COMMUNITY)
Admission: RE | Admit: 2018-09-08 | Discharge: 2018-09-08 | Disposition: A | Discharge status: Home / Self Care | Payer: No Typology Code available for payment source | Source: Ambulatory Visit | Attending: Orthopedic Surgery | Admitting: Orthopedic Surgery

## 2018-09-08 ENCOUNTER — Encounter (HOSPITAL_COMMUNITY): Payer: Self-pay

## 2018-09-08 DIAGNOSIS — Z01812 Encounter for preprocedural laboratory examination: Secondary | ICD-10-CM | POA: Diagnosis not present

## 2018-09-08 LAB — SURGICAL PCR SCREEN
MRSA, PCR: NEGATIVE
Staphylococcus aureus: NEGATIVE

## 2018-09-08 NOTE — Progress Notes (Signed)
Anesthesia Chart Review:  Case:  [id number] Date/Time:  09/10/18 0715   Procedure:  Right L5-S1 disectomy (Right ) - 2 hrs   Anesthesia type:  General   Pre-op diagnosis:  Disc herniation L5/S1 Right   Location:  [facility] OR ROOM 04 / [facility] OR   Surgeon:  Venita Lick, MD      DISCUSSION: Patient is a 53 year old male scheduled for the above procedure. According to H&P, he has "severe radicular leg pain as well as neurological deficits." Pain has not been responsive to non-surgical therapies.   History includes former smoker, tinnitus, positional vertigo, back surgery 1995. He had an elevated BP (146/96) at PAT without diagnosis of HTN--he reports significant pain related to his disc herniation that could be contributing to elevated BP (BP was 122/83 on 02/25/18 prior to back issues).   He was seen by Waldon Merl, PA-C on 09/14/18 for preoperative evaluation with labs and EKG. PTT was mildly elevated at 35.6, but with normal PT/INR and no bleeding issues, so no further work-up recommended at that time as long as surgeon was okay with 09/04/18 lab results. Labs acceptable from an anesthesia standpoint. Since I was unsure if Dr. Shon Baton had reviewed the PTT, I routed him the result and left a voice message with Cordelia Pen at his office for him to review if not done so already.  Patient does not monitor his BP at home. Given normal BP prior to his pain, I think that pain is at least partially contributing. I routed his BP result to Dr. Shon Baton as well, but if patient's preoperative BP results are similar then I would anticipate that he can proceed as planned.     VS: BP (!) 146/96   Pulse 91   Temp 36.6 C   Resp 18   Ht 6\' 2"  (1.88 m)   Wt 86.1 kg   SpO2 99%   BMI 24.38 kg/m   BP Readings from Last 3 Encounters:  09/08/18 (!) 146/96  09/04/18 (!) 130/98  02/25/18 122/83    PROVIDERS: Waldon Merl, PA-C is PCP   LABS: Labs from 09/04/18 noted and results include: Lab Results  Component  Value Date   WBC 4.7 09/04/2018   HGB 15.2 09/04/2018   HCT 43.2 09/04/2018   PLT 245.0 09/04/2018   GLUCOSE 109 (H) 09/04/2018   ALT 44 09/04/2018   AST 27 09/04/2018   NA 137 09/04/2018   K 4.4 09/04/2018   CL 100 09/04/2018   CREATININE 0.93 09/04/2018   BUN 11 09/04/2018   CO2 29 09/04/2018   INR 1.0 09/04/2018  PT 11.5. PTT 35.6.    EKG: 09/04/18: SR   CV: N/A   Past Medical History:  Diagnosis Date  . Anxiety    denies  . Chicken pox   . History of tinnitus    & hearing loss- Followed by ENT-Dr. Annalee Genta  . Positional vertigo     Past Surgical History:  Procedure Laterality Date  . APPENDECTOMY  1999  . BACK SURGERY  1995   x 1  . TONSILLECTOMY  1973    MEDICATIONS: . acetaminophen (TYLENOL) 500 MG tablet  . amphetamine-dextroamphetamine (ADDERALL) 20 MG tablet  . aspirin 81 MG tablet   No current facility-administered medications for this encounter.    ASA on hold for surgery.    Shonna Chock, PA-C Surgical Short Stay/Anesthesiology [facility] Phone [phone] [facility] Phone [phone] 09/08/2018 12:46 PM

## 2018-09-08 NOTE — Progress Notes (Signed)
PCP - Marcelline Mates @ [facility] in [place] Cardiologist - na  Chest x-ray - na EKG - 09/04/18 Stress Test - na ECHO - na Cardiac Cath - na  Sleep Study - na CPAP -   Fasting Blood Sugar - na Checks Blood Sugar _____ times a day  Blood Thinner Instructions: Aspirin Instructions: pt.stopped asa last week  Anesthesia review:   Patient denies shortness of breath, fever, cough and chest pain at PAT appointment   Patient verbalized understanding of instructions that were given to them at the PAT appointment. Patient was also instructed that they will need to review over the PAT instructions again at home before surgery.

## 2018-09-08 NOTE — Anesthesia Preprocedure Evaluation (Addendum)
Anesthesia Evaluation  Patient identified by MRN, date of birth, ID band Patient awake    Reviewed: Allergy & Precautions, NPO status , Patient's Chart, lab work & pertinent test results  Airway Mallampati: I  TM Distance: >3 FB Neck ROM: Full    Dental no notable dental hx. (+) Teeth Intact, Dental Advisory Given   Pulmonary neg pulmonary ROS, former smoker,    Pulmonary exam normal breath sounds clear to auscultation       Cardiovascular Exercise Tolerance: Good Normal cardiovascular exam Rhythm:Regular Rate:Normal     Neuro/Psych PSYCHIATRIC DISORDERS Anxiety negative neurological ROS     GI/Hepatic negative GI ROS, Neg liver ROS,   Endo/Other  negative endocrine ROS  Renal/GU negative Renal ROS     Musculoskeletal   Abdominal   Peds  Hematology Hgb 15.2   Anesthesia Other Findings   Reproductive/Obstetrics                           Lab Results  Component Value Date   WBC 4.7 09/04/2018   HGB 15.2 09/04/2018   HCT 43.2 09/04/2018   MCV 91.9 09/04/2018   PLT 245.0 09/04/2018    Lab Results  Component Value Date   CREATININE 0.93 09/04/2018   BUN 11 09/04/2018   NA 137 09/04/2018   K 4.4 09/04/2018   CL 100 09/04/2018   CO2 29 09/04/2018   Anesthesia Physical Anesthesia Plan  ASA: II  Anesthesia Plan: General   Post-op Pain Management:    Induction: Intravenous  PONV Risk Score and Plan: 2 and Treatment may vary due to age or medical condition, Ondansetron and Dexamethasone  Airway Management Planned: Oral ETT  Additional Equipment:   Intra-op Plan:   Post-operative Plan: Extubation in OR  Informed Consent: I have reviewed the patients History and Physical, chart, labs and discussed the procedure including the risks, benefits and alternatives for the proposed anesthesia with the patient or authorized representative who has indicated his/her understanding and  acceptance.     Dental advisory given  Plan Discussed with:   Anesthesia Plan Comments: (PAT note written 09/08/2018 by Shonna Chock, PA-C. )      Anesthesia Quick Evaluation

## 2018-09-09 MED ORDER — TRANEXAMIC ACID-NACL 1000-0.7 MG/100ML-% IV SOLN
1000.0000 mg | INTRAVENOUS | Status: AC
  Administered 2018-09-10: 1000 mg via INTRAVENOUS
  Filled 2018-09-09: qty 100

## 2018-09-10 ENCOUNTER — Encounter (HOSPITAL_COMMUNITY): Payer: Self-pay

## 2018-09-10 ENCOUNTER — Other Ambulatory Visit: Payer: Self-pay

## 2018-09-10 ENCOUNTER — Ambulatory Visit (HOSPITAL_COMMUNITY): Payer: No Typology Code available for payment source | Admitting: Vascular Surgery

## 2018-09-10 ENCOUNTER — Ambulatory Visit (HOSPITAL_COMMUNITY): Payer: No Typology Code available for payment source

## 2018-09-10 ENCOUNTER — Encounter (HOSPITAL_COMMUNITY)
Admission: RE | Disposition: A | Discharge status: Home / Self Care | Payer: Self-pay | Source: Home / Self Care | Attending: Orthopedic Surgery

## 2018-09-10 ENCOUNTER — Ambulatory Visit (HOSPITAL_COMMUNITY)
Admission: RE | Admit: 2018-09-10 | Discharge: 2018-09-10 | Disposition: A | Discharge status: Home / Self Care | Payer: No Typology Code available for payment source | Attending: Orthopedic Surgery | Admitting: Orthopedic Surgery

## 2018-09-10 ENCOUNTER — Ambulatory Visit (HOSPITAL_COMMUNITY): Payer: No Typology Code available for payment source | Admitting: Registered Nurse

## 2018-09-10 DIAGNOSIS — Z87891 Personal history of nicotine dependence: Secondary | ICD-10-CM | POA: Insufficient documentation

## 2018-09-10 DIAGNOSIS — Z79899 Other long term (current) drug therapy: Secondary | ICD-10-CM | POA: Insufficient documentation

## 2018-09-10 DIAGNOSIS — Z7982 Long term (current) use of aspirin: Secondary | ICD-10-CM | POA: Diagnosis not present

## 2018-09-10 DIAGNOSIS — M5117 Intervertebral disc disorders with radiculopathy, lumbosacral region: Secondary | ICD-10-CM | POA: Diagnosis present

## 2018-09-10 DIAGNOSIS — F988 Other specified behavioral and emotional disorders with onset usually occurring in childhood and adolescence: Secondary | ICD-10-CM | POA: Diagnosis not present

## 2018-09-10 DIAGNOSIS — Z419 Encounter for procedure for purposes other than remedying health state, unspecified: Secondary | ICD-10-CM

## 2018-09-10 DIAGNOSIS — H9190 Unspecified hearing loss, unspecified ear: Secondary | ICD-10-CM | POA: Insufficient documentation

## 2018-09-10 HISTORY — PX: LUMBAR LAMINECTOMY/DECOMPRESSION MICRODISCECTOMY: SHX5026

## 2018-09-10 SURGERY — LUMBAR LAMINECTOMY/DECOMPRESSION MICRODISCECTOMY 1 LEVEL
Anesthesia: General | Site: Spine Lumbar | Laterality: Right

## 2018-09-10 MED ORDER — HEMOSTATIC AGENTS (NO CHARGE) OPTIME
TOPICAL | Status: DC | PRN
  Administered 2018-09-10: 1

## 2018-09-10 MED ORDER — METHYLPREDNISOLONE ACETATE 40 MG/ML IJ SUSP
INTRAMUSCULAR | Status: AC
  Filled 2018-09-10: qty 1

## 2018-09-10 MED ORDER — CEFAZOLIN SODIUM-DEXTROSE 2-4 GM/100ML-% IV SOLN
2.0000 g | INTRAVENOUS | Status: AC
  Administered 2018-09-10: 2 g via INTRAVENOUS
  Filled 2018-09-10: qty 100

## 2018-09-10 MED ORDER — PROMETHAZINE HCL 25 MG/ML IJ SOLN: 6.2500 mg | INTRAMUSCULAR | Status: DC | PRN

## 2018-09-10 MED ORDER — METHOCARBAMOL 500 MG PO TABS: 500.0000 mg | ORAL_TABLET | Freq: Three times a day (TID) | ORAL | 0 refills | Status: AC | PRN

## 2018-09-10 MED ORDER — HYDROCODONE-ACETAMINOPHEN 7.5-325 MG PO TABS
1.0000 | ORAL_TABLET | Freq: Once | ORAL | Status: AC | PRN
  Administered 2018-09-10: 1 via ORAL

## 2018-09-10 MED ORDER — METHYLPREDNISOLONE ACETATE 40 MG/ML IJ SUSP
INTRAMUSCULAR | Status: DC | PRN
  Administered 2018-09-10: 40 mg

## 2018-09-10 MED ORDER — LACTATED RINGERS IV SOLN
INTRAVENOUS | Status: DC | PRN
  Administered 2018-09-10: 07:00:00 via INTRAVENOUS

## 2018-09-10 MED ORDER — KETAMINE HCL 50 MG/5ML IJ SOSY
PREFILLED_SYRINGE | INTRAMUSCULAR | Status: AC
  Filled 2018-09-10: qty 5

## 2018-09-10 MED ORDER — ONDANSETRON HCL 4 MG PO TABS: 4.0000 mg | ORAL_TABLET | Freq: Three times a day (TID) | ORAL | 0 refills | Status: DC | PRN

## 2018-09-10 MED ORDER — BUPIVACAINE-EPINEPHRINE (PF) 0.25% -1:200000 IJ SOLN
INTRAMUSCULAR | Status: AC
  Filled 2018-09-10: qty 30

## 2018-09-10 MED ORDER — FENTANYL CITRATE (PF) 100 MCG/2ML IJ SOLN
INTRAMUSCULAR | Status: DC | PRN
  Administered 2018-09-10: 50 ug via INTRAVENOUS
  Administered 2018-09-10: 100 ug via INTRAVENOUS
  Administered 2018-09-10: 50 ug via INTRAVENOUS

## 2018-09-10 MED ORDER — DEXAMETHASONE SODIUM PHOSPHATE 10 MG/ML IJ SOLN
INTRAMUSCULAR | Status: AC
  Filled 2018-09-10: qty 1

## 2018-09-10 MED ORDER — PROPOFOL 10 MG/ML IV BOLUS
INTRAVENOUS | Status: AC
  Filled 2018-09-10: qty 20

## 2018-09-10 MED ORDER — ROCURONIUM BROMIDE 50 MG/5ML IV SOSY
PREFILLED_SYRINGE | INTRAVENOUS | Status: DC | PRN
  Administered 2018-09-10: 50 mg via INTRAVENOUS
  Administered 2018-09-10: 10 mg via INTRAVENOUS
  Administered 2018-09-10: 20 mg via INTRAVENOUS

## 2018-09-10 MED ORDER — HYDROMORPHONE HCL 1 MG/ML IJ SOLN
0.2500 mg | INTRAMUSCULAR | Status: DC | PRN
  Administered 2018-09-10 (×3): 0.5 mg via INTRAVENOUS

## 2018-09-10 MED ORDER — BUPIVACAINE-EPINEPHRINE 0.25% -1:200000 IJ SOLN
INTRAMUSCULAR | Status: DC | PRN
  Administered 2018-09-10: 10 mL

## 2018-09-10 MED ORDER — 0.9 % SODIUM CHLORIDE (POUR BTL) OPTIME
TOPICAL | Status: DC | PRN
  Administered 2018-09-10: 1000 mL

## 2018-09-10 MED ORDER — HYDROMORPHONE HCL 1 MG/ML IJ SOLN
INTRAMUSCULAR | Status: AC
  Filled 2018-09-10: qty 1

## 2018-09-10 MED ORDER — THROMBIN (RECOMBINANT) 20000 UNITS EX SOLR
CUTANEOUS | Status: AC
  Filled 2018-09-10: qty 20000

## 2018-09-10 MED ORDER — PROPOFOL 10 MG/ML IV BOLUS
INTRAVENOUS | Status: DC | PRN
  Administered 2018-09-10: 180 mg via INTRAVENOUS

## 2018-09-10 MED ORDER — LIDOCAINE 2% (20 MG/ML) 5 ML SYRINGE
INTRAMUSCULAR | Status: AC
  Filled 2018-09-10: qty 5

## 2018-09-10 MED ORDER — DEXAMETHASONE SODIUM PHOSPHATE 10 MG/ML IJ SOLN
INTRAMUSCULAR | Status: DC | PRN
  Administered 2018-09-10: 5 mg via INTRAVENOUS

## 2018-09-10 MED ORDER — LIDOCAINE 2% (20 MG/ML) 5 ML SYRINGE
INTRAMUSCULAR | Status: DC | PRN
  Administered 2018-09-10: 100 mg via INTRAVENOUS

## 2018-09-10 MED ORDER — MIDAZOLAM HCL 5 MG/5ML IJ SOLN
INTRAMUSCULAR | Status: DC | PRN
  Administered 2018-09-10: 2 mg via INTRAVENOUS

## 2018-09-10 MED ORDER — ONDANSETRON HCL 4 MG/2ML IJ SOLN
INTRAMUSCULAR | Status: DC | PRN
  Administered 2018-09-10: 4 mg via INTRAVENOUS

## 2018-09-10 MED ORDER — THROMBIN 20000 UNITS EX SOLR
CUTANEOUS | Status: DC | PRN
  Administered 2018-09-10: 20 mL

## 2018-09-10 MED ORDER — FENTANYL CITRATE (PF) 250 MCG/5ML IJ SOLN
INTRAMUSCULAR | Status: AC
  Filled 2018-09-10: qty 5

## 2018-09-10 MED ORDER — ACETAMINOPHEN 10 MG/ML IV SOLN: 1000.0000 mg | Freq: Once | INTRAVENOUS | Status: DC | PRN

## 2018-09-10 MED ORDER — MEPERIDINE HCL 50 MG/ML IJ SOLN: 6.2500 mg | INTRAMUSCULAR | Status: DC | PRN

## 2018-09-10 MED ORDER — KETAMINE HCL 10 MG/ML IJ SOLN
INTRAMUSCULAR | Status: DC | PRN
  Administered 2018-09-10: 40 mg via INTRAVENOUS

## 2018-09-10 MED ORDER — SUCCINYLCHOLINE CHLORIDE 20 MG/ML IJ SOLN
INTRAMUSCULAR | Status: DC | PRN
  Administered 2018-09-10: 100 mg via INTRAVENOUS

## 2018-09-10 MED ORDER — MIDAZOLAM HCL 2 MG/2ML IJ SOLN
INTRAMUSCULAR | Status: AC
  Filled 2018-09-10: qty 2

## 2018-09-10 MED ORDER — OXYCODONE-ACETAMINOPHEN 10-325 MG PO TABS: 1.0000 | ORAL_TABLET | Freq: Four times a day (QID) | ORAL | 0 refills | Status: AC | PRN

## 2018-09-10 MED ORDER — ROCURONIUM BROMIDE 50 MG/5ML IV SOSY
PREFILLED_SYRINGE | INTRAVENOUS | Status: AC
  Filled 2018-09-10: qty 10

## 2018-09-10 MED ORDER — ONDANSETRON HCL 4 MG/2ML IJ SOLN
INTRAMUSCULAR | Status: AC
  Filled 2018-09-10: qty 2

## 2018-09-10 MED ORDER — HYDROCODONE-ACETAMINOPHEN 7.5-325 MG PO TABS
ORAL_TABLET | ORAL | Status: AC
  Filled 2018-09-10: qty 1

## 2018-09-10 MED ORDER — SUGAMMADEX SODIUM 200 MG/2ML IV SOLN
INTRAVENOUS | Status: DC | PRN
  Administered 2018-09-10 (×2): 100 mg via INTRAVENOUS

## 2018-09-10 MED ORDER — GABAPENTIN 300 MG PO CAPS
300.0000 mg | ORAL_CAPSULE | Freq: Once | ORAL | Status: AC
  Administered 2018-09-10: 300 mg via ORAL
  Filled 2018-09-10: qty 1

## 2018-09-10 MED ORDER — ACETAMINOPHEN 500 MG PO TABS
1000.0000 mg | ORAL_TABLET | Freq: Once | ORAL | Status: AC
  Administered 2018-09-10: 1000 mg via ORAL
  Filled 2018-09-10: qty 2

## 2018-09-10 MED ORDER — SUCCINYLCHOLINE CHLORIDE 200 MG/10ML IV SOSY
PREFILLED_SYRINGE | INTRAVENOUS | Status: AC
  Filled 2018-09-10: qty 10

## 2018-09-10 SURGICAL SUPPLY — 60 items
AGENT HMST KT MTR STRL THRMB (HEMOSTASIS) ×1
BNDG GAUZE ELAST 4 BULKY (GAUZE/BANDAGES/DRESSINGS) ×3 IMPLANT
CANISTER SUCT 3000ML PPV (MISCELLANEOUS) ×3 IMPLANT
CLOSURE STERI-STRIP 1/2X4 (GAUZE/BANDAGES/DRESSINGS) ×1
CLSR STERI-STRIP ANTIMIC 1/2X4 (GAUZE/BANDAGES/DRESSINGS) ×2 IMPLANT
CORD BI POLAR (MISCELLANEOUS) ×3 IMPLANT
COVER SURGICAL LIGHT HANDLE (MISCELLANEOUS) ×3 IMPLANT
COVER WAND RF STERILE (DRAPES) ×3 IMPLANT
DECANTER SPIKE VIAL GLASS SM (MISCELLANEOUS) ×2 IMPLANT
DRAPE POUCH INSTRU U-SHP 10X18 (DRAPES) ×3 IMPLANT
DRAPE SURG 17X23 STRL (DRAPES) ×3 IMPLANT
DRAPE U-SHAPE 47X51 STRL (DRAPES) ×3 IMPLANT
DRSG OPSITE POSTOP 4X6 (GAUZE/BANDAGES/DRESSINGS) ×2 IMPLANT
DURAPREP 26ML APPLICATOR (WOUND CARE) ×3 IMPLANT
ELECT BLADE 4.0 EZ CLEAN MEGAD (MISCELLANEOUS) ×3
ELECT CAUTERY BLADE 6.4 (BLADE) ×3 IMPLANT
ELECT PENCIL ROCKER SW 15FT (MISCELLANEOUS) ×3 IMPLANT
ELECT REM PT RETURN 9FT ADLT (ELECTROSURGICAL) ×3
ELECTRODE BLDE 4.0 EZ CLN MEGD (MISCELLANEOUS) IMPLANT
ELECTRODE REM PT RTRN 9FT ADLT (ELECTROSURGICAL) ×1 IMPLANT
EVACUATOR SILICONE 100CC (DRAIN) ×2 IMPLANT
GLOVE BIO SURGEON STRL SZ 6.5 (GLOVE) ×2 IMPLANT
GLOVE BIO SURGEONS STRL SZ 6.5 (GLOVE) ×1
GLOVE BIOGEL PI IND STRL 6.5 (GLOVE) ×1 IMPLANT
GLOVE BIOGEL PI IND STRL 8.5 (GLOVE) ×1 IMPLANT
GLOVE BIOGEL PI INDICATOR 6.5 (GLOVE) ×2
GLOVE BIOGEL PI INDICATOR 8.5 (GLOVE) ×2
GLOVE SS BIOGEL STRL SZ 8.5 (GLOVE) ×1 IMPLANT
GLOVE SUPERSENSE BIOGEL SZ 8.5 (GLOVE) ×2
GOWN STRL REUS W/ TWL LRG LVL3 (GOWN DISPOSABLE) ×2 IMPLANT
GOWN STRL REUS W/TWL 2XL LVL3 (GOWN DISPOSABLE) ×3 IMPLANT
GOWN STRL REUS W/TWL LRG LVL3 (GOWN DISPOSABLE) ×6
IV CATH AUTO 14GX1.75 SAFE ORG (IV SOLUTION) ×2 IMPLANT
KIT BASIN OR (CUSTOM PROCEDURE TRAY) ×3 IMPLANT
KIT TURNOVER KIT B (KITS) ×3 IMPLANT
NDL SPNL 18GX3.5 QUINCKE PK (NEEDLE) ×2 IMPLANT
NEEDLE 22X1 1/2 (OR ONLY) (NEEDLE) ×5 IMPLANT
NEEDLE SPNL 18GX3.5 QUINCKE PK (NEEDLE) ×6 IMPLANT
NS IRRIG 1000ML POUR BTL (IV SOLUTION) ×3 IMPLANT
PACK LAMINECTOMY ORTHO (CUSTOM PROCEDURE TRAY) ×3 IMPLANT
PACK UNIVERSAL I (CUSTOM PROCEDURE TRAY) ×3 IMPLANT
PAD ARMBOARD 7.5X6 YLW CONV (MISCELLANEOUS) ×8 IMPLANT
PATTIES SURGICAL .5 X.5 (GAUZE/BANDAGES/DRESSINGS) ×3 IMPLANT
PATTIES SURGICAL .5 X1 (DISPOSABLE) ×3 IMPLANT
SPONGE LAP 4X18 RFD (DISPOSABLE) ×2 IMPLANT
SPONGE SURGIFOAM ABS GEL 100 (HEMOSTASIS) ×2 IMPLANT
SURGIFLO W/THROMBIN 8M KIT (HEMOSTASIS) ×2 IMPLANT
SUT BONE WAX W31G (SUTURE) ×5 IMPLANT
SUT MON AB 3-0 SH 27 (SUTURE) ×3
SUT MON AB 3-0 SH27 (SUTURE) ×1 IMPLANT
SUT VIC AB 1 CT1 18XCR BRD 8 (SUTURE) ×1 IMPLANT
SUT VIC AB 1 CT1 8-18 (SUTURE) ×3
SUT VIC AB 2-0 CT1 18 (SUTURE) ×3 IMPLANT
SYR BULB IRRIGATION 50ML (SYRINGE) ×3 IMPLANT
SYR CONTROL 10ML LL (SYRINGE) ×3 IMPLANT
SYR TB 1ML LUER SLIP (SYRINGE) ×4 IMPLANT
TOWEL GREEN STERILE (TOWEL DISPOSABLE) ×3 IMPLANT
TOWEL GREEN STERILE FF (TOWEL DISPOSABLE) ×3 IMPLANT
WATER STERILE IRR 1000ML POUR (IV SOLUTION) ×3 IMPLANT
YANKAUER SUCT BULB TIP NO VENT (SUCTIONS) ×2 IMPLANT

## 2018-09-10 NOTE — H&P (Signed)
Addendum H&P dictated by Dr. Shon Baton  Patient continues to have severe radicular right leg pain.  Clinical exam demonstrates positive right foot drop with 3+/5 motor strength in the gastrocnemius.  Positive numbness and loss to sensation in the right S1 dermatome.  Patient has positive nerve root tension signs.  I have again gone over the surgery with the patient which is a right L5-S1 lumbar microdiscectomy.  I have reviewed the risks and benefits of surgery.  No significant change has occurred since his H&P of 09/03/2018 except for the slight increase in weakness in the gastrocnemius.

## 2018-09-10 NOTE — Discharge Instructions (Signed)

## 2018-09-10 NOTE — Transfer of Care (Signed)
Immediate Anesthesia Transfer of Care Note  Patient: Dalton Moss  Procedure(s) Performed: Right L5-S1 disectomy (Right Spine Lumbar)  Patient Location: PACU  Anesthesia Type:General  Level of Consciousness: awake, alert  and oriented  Airway & Oxygen Therapy: Patient Spontanous Breathing  Post-op Assessment: Report given to RN and Post -op Vital signs reviewed and stable  Post vital signs: Reviewed and stable  Last Vitals:  Vitals Value Taken Time  BP    Temp    Pulse 90 09/10/2018 10:08 AM  Resp    SpO2 99 % 09/10/2018 10:08 AM  Vitals shown include unvalidated device data.  Last Pain:  Vitals:   09/10/18 0622  TempSrc: Oral  PainSc:       Patients Stated Pain Goal: 4 (09/10/18 0555)  Complications: No apparent anesthesia complications

## 2018-09-10 NOTE — Brief Op Note (Signed)
09/10/2018  9:57 AM  PATIENT:  Dalton Moss  53 y.o. male  PRE-OPERATIVE DIAGNOSIS:  Disc herniation L5/S1 Right  POST-OPERATIVE DIAGNOSIS:  Disc herniation L5/S1 Right  PROCEDURE:  Procedure(s) with comments: Right L5-S1 disectomy (Right) - 2 hrs  SURGEON:  Surgeon(s) and Role:    Venita Lick, MD - Primary  PHYSICIAN ASSISTANT:   ASSISTANTS: Amanda Ward, PA   ANESTHESIA:   general  EBL:  75 mL   BLOOD ADMINISTERED:none  DRAINS: none   LOCAL MEDICATIONS USED:  MARCAINE    and OTHER depomedrol  SPECIMEN:  No Specimen  DISPOSITION OF SPECIMEN:  N/A  COUNTS:  YES  TOURNIQUET:  * No tourniquets in log *  DICTATION: .Dragon Dictation  PLAN OF CARE: Discharge to home after PACU  PATIENT DISPOSITION:  PACU - hemodynamically stable.

## 2018-09-10 NOTE — Op Note (Signed)
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  Clinical exam demonstrated positive motor and sensory deficits along with nerve root tension signs.  Clinical deficits consistent with S1 radiculopathy.  MRI confirmed a very large disc herniation causing marked compression of the S1 nerve root.  Due to the progressive neurological deficits we elected to move forward with surgical intervention.  Patient states that the radicular leg pain has been getting progressively worse for the last 3 months.  Operative report: Patient is brought the operating room placed by the operating room table.  After successful induction of general anesthesia and endotracheally patient teds SCDs were applied and he was turned prone onto the Wilson frame.  All bony prominences were well-padded and the back was prepped and draped in standard fashion.  Timeout was taken to confirm patient procedure and all other important data.  Once this was done 2 needles were placed in the back and an x-ray was taken to localize the skin incision.  I infiltrated the marked out incision site with quarter percent Marcaine with epinephrine.  Midline incision was made and sharp dissection was carried out down to the deep tissue.  I incised the deep fascia and stripped the paraspinal muscles to expose the L5 and S1 lamina and facet complex.  A Penfield 4 was placed underneath the L5 lamina and x-ray was taken to confirm I was at the appropriate level.  Once confirmed I used a 3 mm Kerrison Roger to mark the lamina.  The Taylor retractor was placed along the lateral border of the facet complex in order to allow  visualization of the L5-S1 disc space.  A laminotomy was then completed with a 3 and 2 mm Kerrison Roger.  I then began gently dissecting with a Penfield 4 through the ligamentum flavum until I was developing a plane between the ligamentum flavum and the thecal sac.  Using my 2 mm Kerrison rongeurs I remove the ligamentum flavum to expose the thecal sac.  At this point there was significant dorsal compression of the S1 nerve root.  I could not palpate into the foramen and it was quite difficult palpating even the medial border of the S1 pedicle.  At this point I began gently dissected along the lateral border of the nerve.  This allowed me to use my 1 mm Kerrison Roger to perform a medial facetectomy.  At this point I could visualize the posterior lateral aspect of the disc.  I began gently mobilizing the thecal sac and nerve root medially in order to completely expose the very large disc herniation.  An annulotomy was then performed with a 15 blade scalpel and using nerve hooks I began mobilizing the disc fragment.  At this point I was able to eventually mobilize the very large disc fragment laterally to like it remove it with a Penfield 4.  This very large fragment was excised and to fragments.  After removing them it did appear as though the amount of disc material I removed was consistent with the preoperative MRI images.  At this point the S1 nerve root was freely mobile.  It was no longer dorsally displaced and under compression.  I could now get underneath the leading edge of  the S1 lamina and trim that down with a 2 mm Kerrison Roger.  I could now easily palpate the medial border of the S1 pedicle and travel into the S1 foramen.  The nerve root itself was freely mobile and no longer under compression.  I continue to mobilize medially and using my Epstein curette began removing some of the osteophyte complex.  I removed a good portion of the osteophyte with the Epstein curette.  At this point I could freely  pass my nerve hook superiorly laterally inferiorly and medially without obstruction.  There still remains osteophyte hard disc centrally.  I was concerned about being overly aggressive causing a traction injury to the nerve or a CSF tear on the ventral surface of the thecal sac.  At this point I felt as though I had an adequate discectomy and decompression and so I did not elect to be more aggressive with my resection of the osteophyte.  I irrigated the wound copiously normal saline and using bipolar cautery and FloSeal obtained hemostasis.  After second irrigation and debridement did apply approximately 40 mg of Depo-Medrol over the nerve root for postoperative analgesia.  A thrombin-soaked Gelfoam patty was placed over the laminotomy defect and the retractors were removed.  I then closed the deep fascia with interrupted #1 Vicryl suture, superficial 2-0 Vicryl suture, and a 3-0 Monocryl for the skin.  Steri-Strips and a dry dressing were then applied.  Patient was ultimately extubated transfer the PACU without incident.  At the end of the case all needle sponge counts were correct.  There were no adverse intraoperative events.

## 2018-09-10 NOTE — OR Nursing (Signed)
Per protocol Dr. Bradly Chris called at 08:28 to confirm in image 2 the probe was in disc space L5- S1, with the tip overlying the spinous process L5.

## 2018-09-10 NOTE — Anesthesia Postprocedure Evaluation (Signed)
Anesthesia Post Note  Patient: Dalton Moss  Procedure(s) Performed: Right L5-S1 disectomy (Right Spine Lumbar)     Patient location during evaluation: PACU Anesthesia Type: General Level of consciousness: awake and alert Pain management: pain level controlled Vital Signs Assessment: post-procedure vital signs reviewed and stable Respiratory status: spontaneous breathing, nonlabored ventilation, respiratory function stable and patient connected to nasal cannula oxygen Cardiovascular status: blood pressure returned to baseline and stable Postop Assessment: no apparent nausea or vomiting Anesthetic complications: no    Last Vitals:  Vitals:   09/10/18 1109 09/10/18 1124  BP: (!) 144/90 (!) 141/93  Pulse:  88  Resp: 13 12  Temp:  (!) 36.1 C  SpO2: 98% 99%    Last Pain:  Vitals:   09/10/18 1109  TempSrc:   PainSc: 5                  Trevor Iha

## 2018-09-10 NOTE — Anesthesia Procedure Notes (Signed)
Procedure Name: Intubation Date/Time: 09/10/2018 7:46 AM Performed by: Trinna Post., CRNA Pre-anesthesia Checklist: Patient identified, Emergency Drugs available, Suction available, Patient being monitored and Timeout performed Patient Re-evaluated:Patient Re-evaluated prior to induction Oxygen Delivery Method: Circle system utilized Preoxygenation: Pre-oxygenation with 100% oxygen Induction Type: IV induction, Rapid sequence and Cricoid Pressure applied Laryngoscope Size: Mac and 4 Grade View: Grade I Tube type: Oral Tube size: 7.5 mm Number of attempts: 1 Airway Equipment and Method: Stylet Placement Confirmation: ETT inserted through vocal cords under direct vision,  positive ETCO2 and breath sounds checked- equal and bilateral Secured at: 22 cm Tube secured with: Tape Dental Injury: Teeth and Oropharynx as per pre-operative assessment

## 2018-09-11 ENCOUNTER — Encounter (HOSPITAL_COMMUNITY): Payer: Self-pay | Admitting: Orthopedic Surgery

## 2018-09-11 MED FILL — Thrombin (Recombinant) For Soln 20000 Unit: CUTANEOUS | Qty: 1 | Status: AC

## 2018-10-13 ENCOUNTER — Other Ambulatory Visit: Payer: Self-pay | Admitting: Physician Assistant

## 2018-10-13 NOTE — Telephone Encounter (Signed)
Requested medication (s) are due for refill today: yes  Requested medication (s) are on the active medication list: yes  Last refill:  07/04/18  Future visit scheduled: no  Notes to clinic:  No UDS in last 360 days                           Medication not delegated to NT to refill   Requested Prescriptions  Pending Prescriptions Disp Refills   amphetamine-dextroamphetamine (ADDERALL) 20 MG tablet 60 tablet 0    Sig: Take 1 tablet (20 mg total) by mouth 2 (two) times daily for 30 days.     Not Delegated - Psychiatry:  Stimulants/ADHD Failed - 10/13/2018  1:21 PM      Failed - This refill cannot be delegated      Failed - Urine Drug Screen completed in last 360 days.      Passed - Valid encounter within last 3 months    Recent Outpatient Visits          1 month ago Preoperative examination   [facility], [place], [place]   7 months ago Attention deficit disorder, unspecified hyperactivity presence   [facility], Kristine Garbe, [place]   1 year ago Anxiety and depression   [facility], [place], [place]   1 year ago Attention deficit disorder, unspecified hyperactivity presence   [facility], Kristine Garbe, [place]   2 years ago Attention deficit disorder, unspecified hyperactivity presence   [facility], [place] C, [place]

## 2018-10-14 MED ORDER — AMPHETAMINE-DEXTROAMPHETAMINE 20 MG PO TABS: 20.0000 mg | ORAL_TABLET | Freq: Two times a day (BID) | ORAL | 0 refills | Status: DC

## 2018-10-14 NOTE — Telephone Encounter (Signed)
Last refill:07/04/18 #60, 0 Last OV:09/04/18

## 2018-12-02 ENCOUNTER — Other Ambulatory Visit: Payer: Self-pay | Admitting: Physician Assistant

## 2018-12-02 MED ORDER — AMPHETAMINE-DEXTROAMPHETAMINE 20 MG PO TABS: 20.0000 mg | ORAL_TABLET | Freq: Two times a day (BID) | ORAL | 0 refills | Status: DC

## 2018-12-02 NOTE — Telephone Encounter (Signed)
ADD was not addressed at that time. He will have to be seen every 6 months for ADD for [place] to continue prescribing the controlled medication. I will allow refill at this time but he needs to schedule a follow-up in the next 2 months for me to continue prescribing.

## 2018-12-02 NOTE — Addendum Note (Signed)
Addended by: Brunetta Jeans on: 12/02/2018 04:06 PM   Modules accepted: Orders

## 2018-12-02 NOTE — Telephone Encounter (Signed)
Adderall last rx 10/14/18 #60 CSC: 02/25/18 UDS: 03/19/17 LOV: 09/04/18 Pre-op exam for back surgery Patient is due for CPE

## 2018-12-02 NOTE — Telephone Encounter (Signed)
Spoke with patient and advised that he would need an appointment for refills of the Adderall. Patient states he is self-pay. He states he had the visit in March for pre-op exam. Please advise

## 2018-12-02 NOTE — Telephone Encounter (Signed)
Copied from [place] [id number]. Topic: Quick Communication - Rx Refill/Question >> Dec 02, 2018  9:37 AM Waylan Rocher, Lumin L wrote: Medication:amphetamine-dextroamphetamine (ADDERALL) 20 MG tablet (2 days left os script)  Has the patient contacted their pharmacy? {yes (Agent: If no, request that the patient contact the pharmacy for the refill.) (Agent: If yes, when and what did the pharmacy advise?)  Preferred Pharmacy (with phone number or street name): [id number] [address], [address] [place] [address] Phone: [phone] Fax: [phone]  Agent: Please be advised that RX refills may take up to 3 business days. We ask that you follow-up with your pharmacy.

## 2019-02-02 ENCOUNTER — Other Ambulatory Visit: Payer: Self-pay | Admitting: *Deleted

## 2019-02-02 NOTE — Telephone Encounter (Signed)
Last OV: 09/04/2018 Last Filled: 12/02/2018 #60, 0

## 2019-02-03 ENCOUNTER — Encounter: Payer: Self-pay | Admitting: Physician Assistant

## 2019-02-03 ENCOUNTER — Ambulatory Visit (INDEPENDENT_AMBULATORY_CARE_PROVIDER_SITE_OTHER): Payer: No Typology Code available for payment source | Admitting: Physician Assistant

## 2019-02-03 ENCOUNTER — Other Ambulatory Visit: Payer: Self-pay

## 2019-02-03 DIAGNOSIS — F988 Other specified behavioral and emotional disorders with onset usually occurring in childhood and adolescence: Secondary | ICD-10-CM

## 2019-02-03 MED ORDER — AMPHETAMINE-DEXTROAMPHETAMINE 20 MG PO TABS: 20.0000 mg | ORAL_TABLET | Freq: Two times a day (BID) | ORAL | 0 refills | Status: DC

## 2019-02-03 NOTE — Progress Notes (Signed)
I have discussed the procedure for the virtual visit with the patient who has given consent to proceed with assessment and treatment.   Dalton Moss S Eluzer Howdeshell, CMA     

## 2019-02-03 NOTE — Progress Notes (Signed)
   Virtual Visit via Video   I connected with patient on 02/03/19 at  1:00 PM EDT by a video enabled telemedicine application and verified that I am speaking with the correct person using two identifiers.  Location patient: Home Location provider: [place], Office Persons participating in the virtual visit: Patient, Provider, [place] (Patina Moore)  I discussed the limitations of evaluation and management by telemedicine and the availability of in person appointments. The patient expressed understanding and agreed to proceed.  Subjective:   HPI:    Patient presents via Doxy[employer]Me today for follow-up of ADD. Patient is currently on a regimen of Adderall 20 mg BID. Endorses taking medications as directed. Sometimes only taking once daily. Has been taking only on weekdays for the most part. Just depends on what all he needs to accomplish. Notes still tolerating well without side effect. No issue with sleep or appetite. Denies chest pain or SOB.    ROS:   See pertinent positives and negatives per HPI.  Patient Active Problem List   Diagnosis Date Noted  . Colon cancer screening 02/25/2018  . Anxiety and depression 02/04/2017  . Attention deficit disorder 10/31/2015  . Visit for preventive health examination 09/14/2015  . Prostate cancer screening 09/14/2015  . Elevated LDL cholesterol level 11/27/2013  . Low testosterone 11/21/2011  . Soft tissue mass 10/21/2011  . 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 Smoking status: Former Smoker    Packs/day: 0.25    Years: 10.00    Pack years: 2.50    Types: Cigarettes    Quit date: 09/08/2007    Years since quitting: 11.4  . Smokeless tobacco: Former Systems developer    Types: Chew  . Tobacco comment: history of 1 ppd for 7-8 years. Currently using nicotine gum to assist stopping chewing tobacco.  Substance Use Topics  . Alcohol use: Yes    Alcohol/week: 6.0 standard drinks    Types: 6 Cans of beer per week     Current Outpatient Medications:  .  amphetamine-dextroamphetamine (ADDERALL) 20 MG tablet, Take 1 tablet (20 mg total) by mouth 2 (two) times daily for 30 days., Disp: 60 tablet, Rfl: 0  No Known Allergies  Objective:   There were no vitals taken for this visit.  Patient is well-developed, well-nourished in no acute distress.  Resting comfortably at home.  Head is normocephalic, atraumatic.  No labored breathing.  Speech is clear and coherent with logical content.  Patient is alert and oriented at baseline.   Assessment and Plan:   1. Attention deficit disorder, unspecified hyperactivity presence Stable. CSC up-to-date. Will renew next month when he comes in for a CPE. PDMP reviewed. No red flags. Continue current regimen. Medications refilled.   Patient is going to call back to schedule CPE as he is overdue.     Leeanne Rio, PA-C 02/03/2019

## 2019-02-03 NOTE — Telephone Encounter (Signed)
Patient has been scheduled for a virtual appointment to receive medication.

## 2019-05-09 ENCOUNTER — Other Ambulatory Visit: Payer: Self-pay

## 2019-05-09 DIAGNOSIS — Z20822 Contact with and (suspected) exposure to covid-19: Secondary | ICD-10-CM

## 2019-05-11 LAB — NOVEL CORONAVIRUS, NAA: SARS-CoV-2, NAA: NOT DETECTED

## 2019-06-04 ENCOUNTER — Other Ambulatory Visit: Payer: Self-pay

## 2019-06-04 MED ORDER — AMPHETAMINE-DEXTROAMPHETAMINE 20 MG PO TABS: 20.0000 mg | ORAL_TABLET | Freq: Two times a day (BID) | ORAL | 0 refills | Status: DC

## 2019-06-04 NOTE — Telephone Encounter (Signed)
Last refill: 8.18.20 #60, 0 Last OV: 8.18.20 dx. ADD

## 2019-08-28 ENCOUNTER — Other Ambulatory Visit: Payer: Self-pay | Admitting: Physician Assistant

## 2019-08-28 DIAGNOSIS — F988 Other specified behavioral and emotional disorders with onset usually occurring in childhood and adolescence: Secondary | ICD-10-CM

## 2019-08-28 NOTE — Telephone Encounter (Signed)
°  LAST APPOINTMENT DATE: 06/04/2019   NEXT APPOINTMENT DATE:@3 /15/2021  MEDICATION:amphetamine-dextroamphetamine (ADDERALL) 20 MG tablet(Expired)  PHARMACY:[id number] - [place], [place] - [address]  COMMENT: Patient has at least 2 pills left but would like a refill before his scheduled appt.   **Let patient know to contact pharmacy at the end of the day to make sure medication is ready. **  ** Please notify patient to allow 48-72 hours to process**  **Encourage patient to contact the pharmacy for refills or they can request refills through [facility]**  CLINICAL FILLS OUT ALL BELOW:   LAST REFILL:  QTY:  REFILL DATE:    OTHER COMMENTS:    Okay for refill?  Please advise

## 2019-08-28 NOTE — Telephone Encounter (Signed)
Adderall last rx 06/04/19 bid #60 LOV: 02/03/19 ADD CSC: 02/25/2018  Patient has upcoming appt 08/31/19

## 2019-08-30 MED ORDER — AMPHETAMINE-DEXTROAMPHETAMINE 20 MG PO TABS: 20.0000 mg | ORAL_TABLET | Freq: Two times a day (BID) | ORAL | 0 refills | Status: DC

## 2019-08-31 ENCOUNTER — Encounter: Payer: Self-pay | Admitting: Physician Assistant

## 2019-08-31 ENCOUNTER — Ambulatory Visit (INDEPENDENT_AMBULATORY_CARE_PROVIDER_SITE_OTHER): Payer: No Typology Code available for payment source | Admitting: Physician Assistant

## 2019-08-31 ENCOUNTER — Other Ambulatory Visit: Payer: Self-pay

## 2019-08-31 DIAGNOSIS — F988 Other specified behavioral and emotional disorders with onset usually occurring in childhood and adolescence: Secondary | ICD-10-CM

## 2019-08-31 NOTE — Progress Notes (Signed)
I have discussed the procedure for the virtual visit with the patient who has given consent to proceed with assessment and treatment.   Crystle Carelli S Clee Pandit, CMA     

## 2019-08-31 NOTE — Patient Instructions (Signed)
Instructions sent to MyChart.  I am glad you are doing so well! We will plan on continuing the current medication regimen. We will follow-up in 6 months.  We should really do your physical at that time.  Follow-up sooner if needed.  Stay safe!

## 2019-08-31 NOTE — Progress Notes (Signed)
   Virtual Visit via Video   I connected with patient on 08/31/19 at 10:00 AM EDT by a video enabled telemedicine application and verified that I am speaking with the correct person using two identifiers.  Location patient: Home Location provider: [place] [facility], Office Persons participating in the virtual visit: Patient, Provider, CMA (Patina Moore)  I discussed the limitations of evaluation and management by telemedicine and the availability of in person appointments. The patient expressed understanding and agreed to proceed.  Subjective:   HPI:   Patient presents via D[employer] today to follow-up for ADD. Patient is currently on a regimen of Adderall 20 mg BID. Endorses taking medications as directed. Notes still tolerating the medication well without side effect.  Only takes on workdays, when needed.  Feels medication is still doing what it needs to do for him.  Work is going well.  ROS:   See pertinent positives and negatives per HPI.  Patient Active Problem List   Diagnosis Date Noted  . Lumbar spondylosis 07/14/2018  . Colon cancer screening 02/25/2018  . Anxiety and depression 02/04/2017  . Attention deficit disorder 10/31/2015  . Visit for preventive health examination 09/14/2015  . Prostate cancer screening 09/14/2015  . Elevated LDL cholesterol level 11/27/2013  . Low testosterone 11/21/2011  . Soft tissue mass 10/21/2011  . 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 Smoking status: Former Smoker    Packs/day: 0.25    Years: 10.00    Pack years: 2.50    Types: Cigarettes    Quit date: 09/08/2007    Years since quitting: 11.9  . Smokeless tobacco: Former Neurosurgeon    Types: Chew  . Tobacco comment: history of 1 ppd for 7-8 years. Currently using nicotine gum to assist stopping chewing tobacco.  Substance Use Topics  . Alcohol use: Yes    Alcohol/week: 6.0 standard drinks    Types: 6 Cans of beer per week    Current Outpatient  Medications:  .  amphetamine-dextroamphetamine (ADDERALL) 20 MG tablet, Take 1 tablet (20 mg total) by mouth 2 (two) times daily., Disp: 60 tablet, Rfl: 0 .  Multiple Vitamin (MULTIVITAMIN) tablet, Take 1 tablet by mouth daily., Disp: , Rfl:  .  ASPIRIN 81 PO, Take 1 tablet by mouth daily., Disp: , Rfl:   No Known Allergies  Objective:   There were no vitals taken for this visit.  Patient is well-developed, well-nourished in no acute distress.  Resting comfortably at home.  Head is normocephalic, atraumatic.  No labored breathing.  Speech is clear and coherent with logical content.  Patient is alert and oriented at baseline.   Assessment and Plan:   1. Attention deficit disorder, unspecified hyperactivity presence Patient continues to do very well on current regimen.  Will continue same.  Follow-up in 6 months.  He is due for CPE and will schedule at the same time.    Piedad Climes, PA-C 08/31/2019

## 2019-09-21 ENCOUNTER — Ambulatory Visit: Payer: No Typology Code available for payment source | Attending: Internal Medicine

## 2019-09-21 DIAGNOSIS — Z23 Encounter for immunization: Secondary | ICD-10-CM

## 2019-09-21 NOTE — Progress Notes (Signed)
   Covid-19 Vaccination Clinic  Name:  Dalton Moss    MRN: [id number] DOB: 1966/02/13  09/21/2019  [name] was observed post Covid-19 immunization for 15 minutes without incident. He was provided with Vaccine Information Sheet and instruction to access the V-Safe system.   [name] was instructed to call 911 with any severe reactions post vaccine: Marland Kitchen Difficulty breathing  . Swelling of face and throat  . A fast heartbeat  . A bad rash all over body  . Dizziness and weakness   Immunizations Administered    Name Date Dose VIS Date Route   Pfizer COVID-19 Vaccine 09/21/2019  9:54 AM 0.3 mL 05/29/2019 Intramuscular   Manufacturer: [employer], [employer]   Lot: [id number]   NDC: [address]-1

## 2019-10-07 ENCOUNTER — Ambulatory Visit: Payer: Self-pay

## 2019-10-12 ENCOUNTER — Ambulatory Visit: Payer: No Typology Code available for payment source | Attending: Internal Medicine

## 2019-10-12 DIAGNOSIS — Z23 Encounter for immunization: Secondary | ICD-10-CM

## 2019-10-12 NOTE — Progress Notes (Signed)
   Covid-19 Vaccination Clinic  Name:  Dalton Moss    MRN: [id number] DOB: 1965-08-15  10/12/2019  [name] was observed post Covid-19 immunization for 15 minutes without incident. He was provided with Vaccine Information Sheet and instruction to access the V-Safe system.   [name] was instructed to call 911 with any severe reactions post vaccine: Marland Kitchen Difficulty breathing  . Swelling of face and throat  . A fast heartbeat  . A bad rash all over body  . Dizziness and weakness   Immunizations Administered    Name Date Dose VIS Date Route   Pfizer COVID-19 Vaccine 10/12/2019  8:09 AM 0.3 mL 08/12/2018 Intramuscular   Manufacturer: [employer], [employer]   Lot: [id number]   NDC: [address]-9

## 2019-11-24 ENCOUNTER — Telehealth: Payer: Self-pay | Admitting: Physician Assistant

## 2019-11-24 DIAGNOSIS — F988 Other specified behavioral and emotional disorders with onset usually occurring in childhood and adolescence: Secondary | ICD-10-CM

## 2019-11-24 MED ORDER — AMPHETAMINE-DEXTROAMPHETAMINE 20 MG PO TABS: 20.0000 mg | ORAL_TABLET | Freq: Two times a day (BID) | ORAL | 0 refills | Status: DC

## 2019-11-24 NOTE — Telephone Encounter (Signed)
Refill has been sent to the pharmacy.  

## 2019-11-24 NOTE — Telephone Encounter (Signed)
Patient is requesting refill on Adderall 20 MG to be sent to [facility] on [address].

## 2020-02-11 ENCOUNTER — Other Ambulatory Visit: Payer: Self-pay | Admitting: Physician Assistant

## 2020-02-11 DIAGNOSIS — F988 Other specified behavioral and emotional disorders with onset usually occurring in childhood and adolescence: Secondary | ICD-10-CM

## 2020-02-11 NOTE — Telephone Encounter (Signed)
Patient called needing his adderall refilled - Please send to [facility] on [address] - last visit;  08/31/2019

## 2020-02-11 NOTE — Telephone Encounter (Signed)
Adderall last rx 11/24/19 #60 LOV: 08/31/19 ADD CSC: 02/25/18

## 2020-02-12 MED ORDER — AMPHETAMINE-DEXTROAMPHETAMINE 20 MG PO TABS: 20.0000 mg | ORAL_TABLET | Freq: Two times a day (BID) | ORAL | 0 refills | Status: DC

## 2020-04-27 ENCOUNTER — Telehealth: Payer: Self-pay | Admitting: Physician Assistant

## 2020-04-27 DIAGNOSIS — F988 Other specified behavioral and emotional disorders with onset usually occurring in childhood and adolescence: Secondary | ICD-10-CM

## 2020-04-27 NOTE — Telephone Encounter (Signed)
..  Medication Refills  Medication:Adderall 20 mg.  Pharmacy:[facility] on [address] in [place], [place]  ** Let patient know to contact pharmacy at the end of the day to make sure medication is ready.**  ** Please notify patient to allow 48-72 hours to process.**  ** Encourage patient to contact the pharmacy for refills or they can request refills through [facility]**  Clinical Fills out below:   Last refill:  QTY:  Refill Date:    Other Comments:   Okay for refill?  Please advise.

## 2020-04-27 NOTE — Telephone Encounter (Signed)
  Patient is over due for a follow up appointment. Will call patient to schedule. He is due is for CPE

## 2020-04-28 NOTE — Telephone Encounter (Signed)
Patient scheduled for video visit tomorrow at 9:30am for Adderall refill. He is working in [place] and only getting back to town on the weekends.

## 2020-04-29 ENCOUNTER — Encounter: Payer: Self-pay | Admitting: Physician Assistant

## 2020-04-29 ENCOUNTER — Telehealth (INDEPENDENT_AMBULATORY_CARE_PROVIDER_SITE_OTHER): Payer: No Typology Code available for payment source | Admitting: Physician Assistant

## 2020-04-29 ENCOUNTER — Other Ambulatory Visit: Payer: Self-pay

## 2020-04-29 DIAGNOSIS — F988 Other specified behavioral and emotional disorders with onset usually occurring in childhood and adolescence: Secondary | ICD-10-CM

## 2020-04-29 MED ORDER — AMPHETAMINE-DEXTROAMPHETAMINE 20 MG PO TABS: 20.0000 mg | ORAL_TABLET | Freq: Two times a day (BID) | ORAL | 0 refills | Status: DC

## 2020-04-29 NOTE — Progress Notes (Signed)
   Virtual Visit via Video   I connected with patient on 04/29/20 at  9:30 AM EST by a video enabled telemedicine application and verified that I am speaking with the correct person using two identifiers.  Location patient: Home Location provider: [place] [facility], Office Persons participating in the virtual visit: Patient, Provider, CMA (Patina Moore)  I discussed the limitations of evaluation and management by telemedicine and the availability of in person appointments. The patient expressed understanding and agreed to proceed.  Subjective:   HPI:   Patient presents via [employer] today for follow-up of ADD. Is currently on a regimen of Adderall 20 mg BID. Endorses taking as directed on work days. Takes holiday from medication when off of work. Notes good relief of symptoms with medication. Denies palpitations, chest pain. Is eating well and sleeping well. Notes mood is stable.  ROS:   See pertinent positives and negatives per HPI.  Patient Active Problem List   Diagnosis Date Noted  . Lumbar spondylosis 07/14/2018  . Colon cancer screening 02/25/2018  . Anxiety and depression 02/04/2017  . Attention deficit disorder 10/31/2015  . Visit for preventive health examination 09/14/2015  . Prostate cancer screening 09/14/2015  . Elevated LDL cholesterol level 11/27/2013  . Low testosterone 11/21/2011  . Soft tissue mass 10/21/2011  . 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 Smoking status: Former Smoker    Packs/day: 0.25    Years: 10.00    Pack years: 2.50    Types: Cigarettes    Quit date: 09/08/2007    Years since quitting: 12.6  . Smokeless tobacco: Former Neurosurgeon    Types: Chew  . Tobacco comment: history of 1 ppd for 7-8 years. Currently using nicotine gum to assist stopping chewing tobacco.  Substance Use Topics  . Alcohol use: Yes    Alcohol/week: 6.0 standard drinks    Types: 6 Cans of beer per week    Current Outpatient  Medications:  .  amphetamine-dextroamphetamine (ADDERALL) 20 MG tablet, Take 1 tablet (20 mg total) by mouth 2 (two) times daily. Will need follow-up before next refill., Disp: 60 tablet, Rfl: 0 .  Multiple Vitamin (MULTIVITAMIN) tablet, Take 1 tablet by mouth daily., Disp: , Rfl:   No Known Allergies  Objective:   There were no vitals taken for this visit.  Patient is well-developed, well-nourished in no acute distress.  Resting comfortably at home.  Head is normocephalic, atraumatic.  No labored breathing.  Speech is clear and coherent with logical content.  Patient is alert and oriented at baseline.   Assessment and Plan:   1. Attention deficit disorder, unspecified hyperactivity presence Doing very well. Will continue current regimen. Plan for CPE in January.    Piedad Climes, PA-C 04/29/2020

## 2020-04-29 NOTE — Progress Notes (Signed)
I have discussed the procedure for the virtual visit with the patient who has given consent to proceed with assessment and treatment.   Erza Mothershead S Zelpha Messing, CMA     

## 2020-08-15 ENCOUNTER — Other Ambulatory Visit: Payer: Self-pay | Admitting: Physician Assistant

## 2020-08-15 DIAGNOSIS — F988 Other specified behavioral and emotional disorders with onset usually occurring in childhood and adolescence: Secondary | ICD-10-CM

## 2020-08-15 MED ORDER — AMPHETAMINE-DEXTROAMPHETAMINE 20 MG PO TABS: 20.0000 mg | ORAL_TABLET | Freq: Two times a day (BID) | ORAL | 0 refills | Status: DC

## 2020-08-15 NOTE — Telephone Encounter (Signed)
..  Medication Refills  Last OV: 04/29/20   Medication:  Adderall   Ph[facility]  Let patient know to contact pharmacy at the end of the day to make sure medication is ready.   Please notify patient to allow 48-72 hours to process.  Encourage patient to contact the pharmacy for refills or they can request refills through [facility]  Clinical Fills out below:   Last refill: 04/29/20     QTY:  Refill Date:    Other Comments: Made pt aware that Selena Batten is not longer at our office asked pt to call [facility] to see about a TOC   Okay for refill?  Please advise.

## 2020-08-15 NOTE — Telephone Encounter (Signed)
Adderall last rx 04/29/20 #60 LOV: 04/29/20 ADD CSC: 02/25/18

## 2020-11-14 IMAGING — CR LUMBAR SPINE - 1 VIEW
1 series · 1 of 1 positions shown · non-contrast
Comparison: Lumbar spine MRI-08/23/2018

CLINICAL DATA: Right L5-S1 discectomy.

EXAM:
LUMBAR SPINE - 1 VIEW

[lateral]
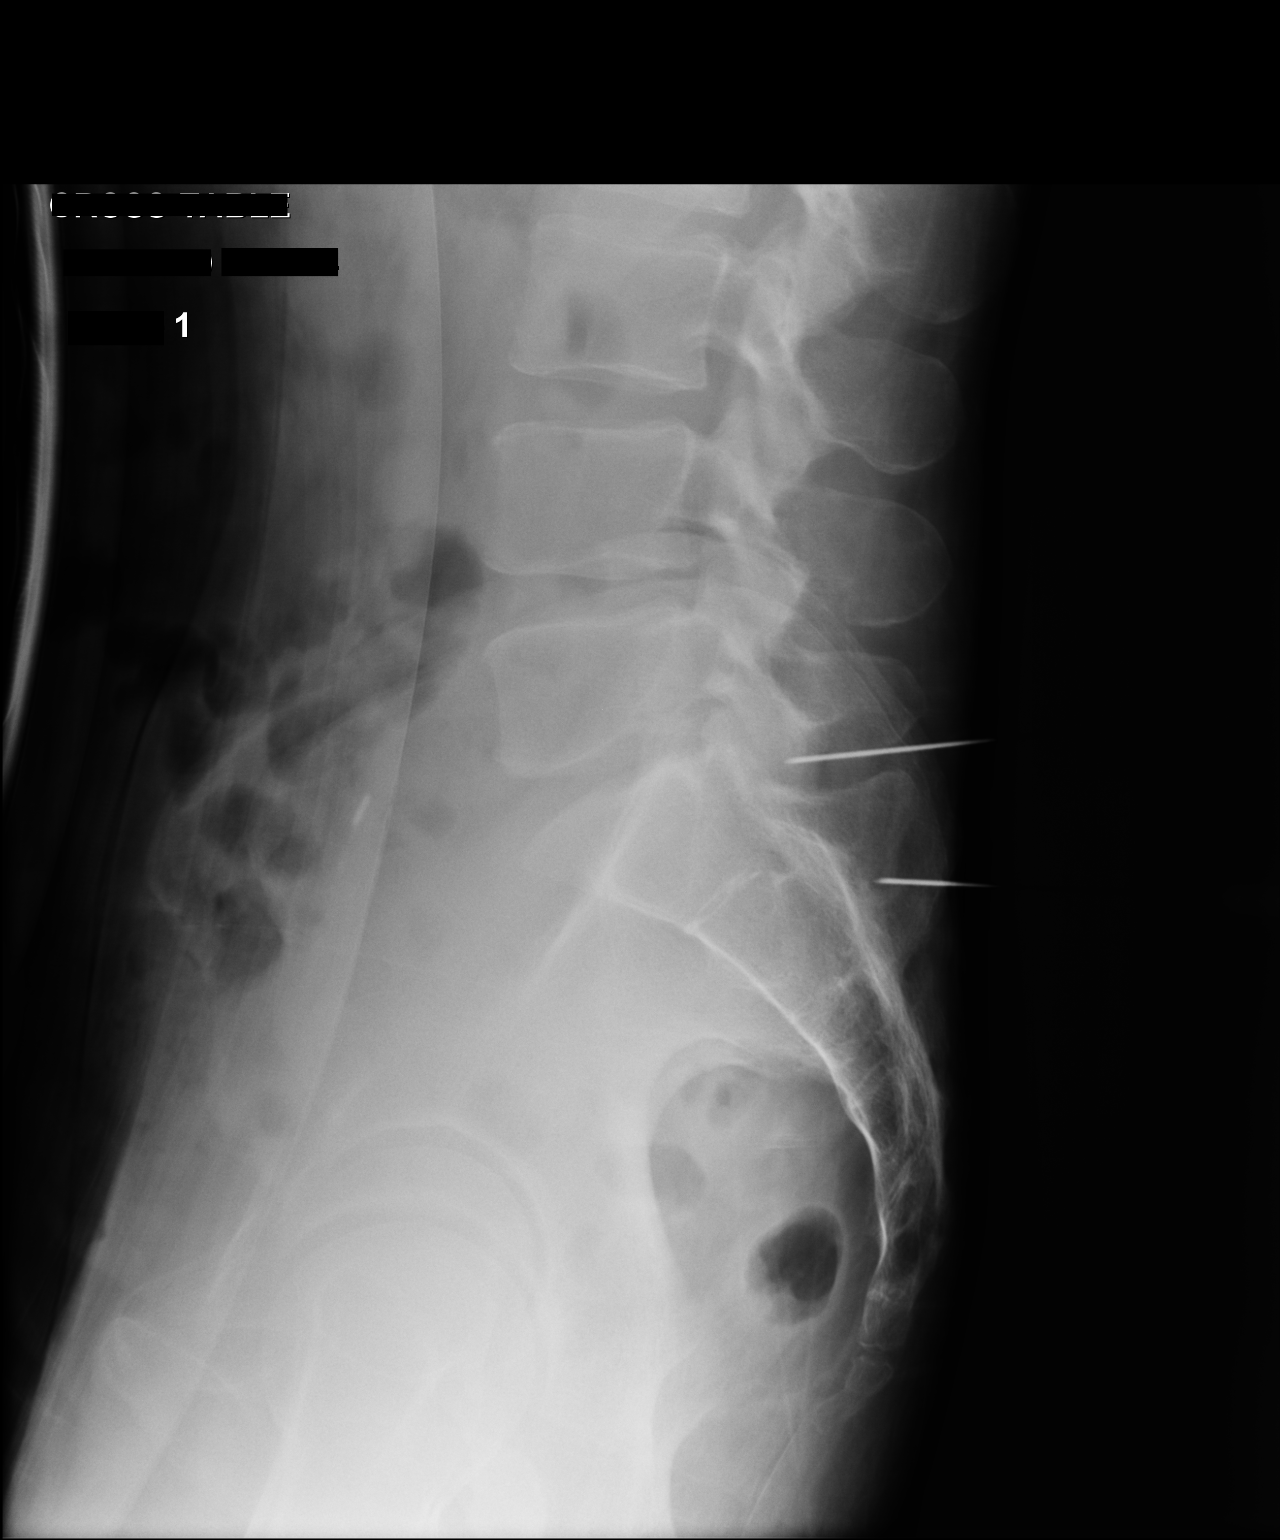

[1 of 1 positions shown; findings below may reference images not displayed]

FINDINGS: Single spot radiographic image of the lower lumbar spine is provided
for review. Spinal labeling is in keeping with preprocedural lumbar
spine MRI.

Provided image demonstrates marking needles overlying the soft
tissues posterior to the L5-S1 intervertebral disc space as well as
the S1-S2 articulation.
IMPRESSION: Intraoperative spinal localization as above.

## 2020-11-14 IMAGING — CR PORTABLE SPINE - 1 VIEW
1 series · 1 of 1 positions shown · non-contrast
Comparison: MRI 08/23/2018

CLINICAL DATA: Right L5-S1 discectomy

EXAM:
PORTABLE SPINE - 1 VIEWl

[lateral]
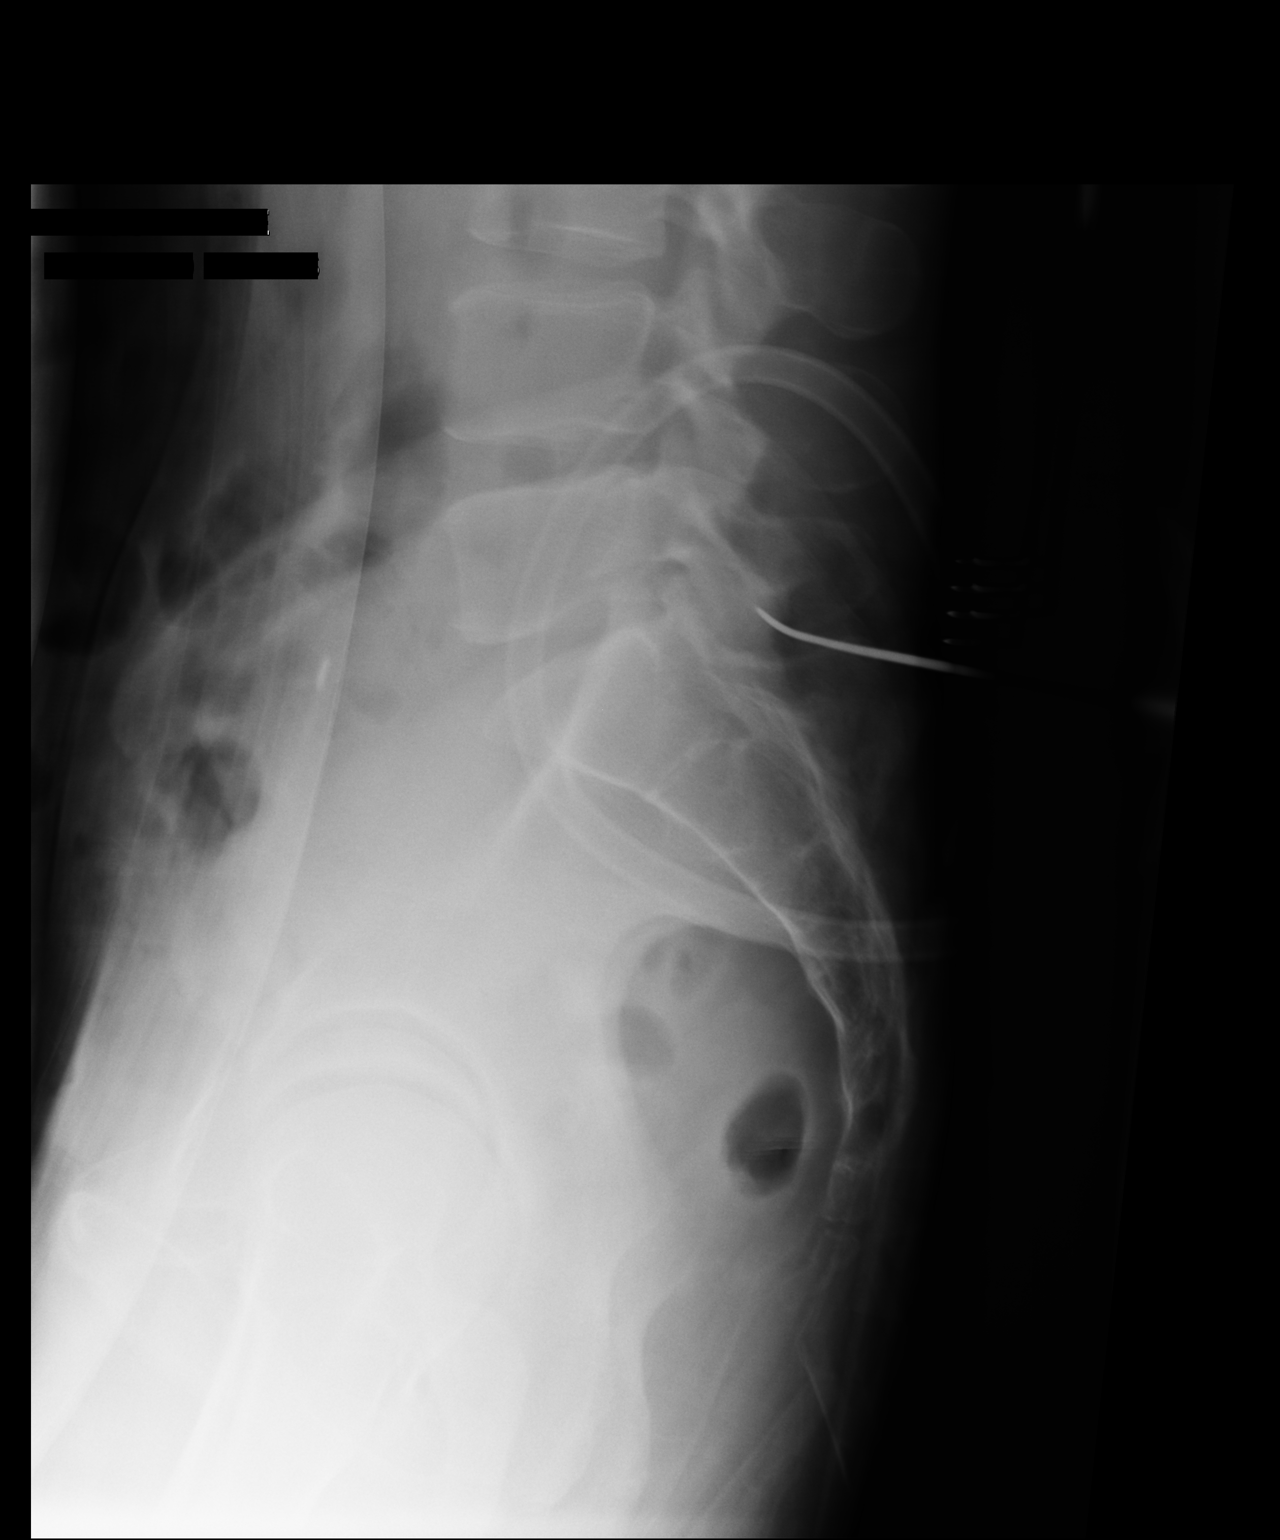

[1 of 1 positions shown; findings below may reference images not displayed]

FINDINGS: Lumbar spine levels annotated. There is a surgical probe with tip
overlying the L5 spinous process posterior to the L5-S1 disc space.
IMPRESSION: 1. Surgical probe localizes the L5-S1 disc space.

## 2021-02-16 ENCOUNTER — Encounter: Payer: Self-pay | Admitting: Family Medicine

## 2021-02-16 ENCOUNTER — Other Ambulatory Visit: Payer: Self-pay

## 2021-02-16 ENCOUNTER — Ambulatory Visit (INDEPENDENT_AMBULATORY_CARE_PROVIDER_SITE_OTHER): Payer: 59 | Admitting: Family Medicine

## 2021-02-16 VITALS — BP 122/76 | HR 89 | Temp 97.6°F | Ht 74.0 in | Wt 207.8 lb

## 2021-02-16 DIAGNOSIS — F988 Other specified behavioral and emotional disorders with onset usually occurring in childhood and adolescence: Secondary | ICD-10-CM | POA: Diagnosis not present

## 2021-02-16 DIAGNOSIS — R0683 Snoring: Secondary | ICD-10-CM

## 2021-02-16 MED ORDER — ATOMOXETINE HCL 40 MG PO CAPS: 40.0000 mg | ORAL_CAPSULE | Freq: Every day | ORAL | 1 refills | Status: AC

## 2021-02-16 NOTE — Progress Notes (Addendum)
Established Patient Office Visit  Subjective:  Patient ID: Dalton Moss, male    DOB: [birth year]  Age: 55 y.o. MRN: [id number]  CC:  Chief Complaint  Patient presents with   Transitions Of Care    TOC from Dr. Daphine Deutscher, would like to start back on Adderall patient not fasting.     HPI Dalton Moss presents for establishment of care by way of transfer.  Has been treated for attention deficit in the past with Adderall.  He takes 10 mg twice daily.  Is has helped with focus.  He also has problems with sleeping.  He has been treated for depression and anxiety in the past that does not seem to be an issue now.  Remembers trouble with focusing during college.  He was not diagnosed as a child.  He has not tried other medications for focus.  Wife tells him that he snores loudly.  There is a question of apnea.  Denies sinus symptoms such as nasal congestion postnasal drip sneezing or cough.  He is claustrophobic. Chart review shows positive ETG in 2018.  Past Medical History:  Diagnosis Date   Anxiety    denies   Chicken pox    History of tinnitus    & hearing loss- Followed by ENT-Dr. Annalee Genta   Positional vertigo     Past Surgical History:  Procedure Laterality Date   APPENDECTOMY  1999   BACK SURGERY  1995   x 1   LUMBAR LAMINECTOMY/DECOMPRESSION MICRODISCECTOMY Right 09/10/2018   Procedure: Right L5-S1 disectomy;  Surgeon: Venita Lick, MD;  Location: [facility] OR;  Service: Orthopedics;  Laterality: Right;  2 hrs   TONSILLECTOMY  1973    Family History  Problem Relation Age of Onset   Heart disease Neg Hx    Stroke Neg Hx    Diabetes Neg Hx    Hypertension Neg Hx    Breast cancer Neg Hx    Colon cancer Neg Hx    Prostate cancer Neg Hx     Social History   Socioeconomic History   Marital status: Married    Spouse name: Not on file   Number of children: Not on file   Years of education: Not on file   Highest education level: Not on file  Occupational History   Not on  file  Tobacco Use   Smoking status: Former    Packs/day: 0.25    Years: 10.00    Pack years: 2.50    Types: Cigarettes    Quit date: 09/08/2007    Years since quitting: 13.4   Smokeless tobacco: Former    Types: Chew   Tobacco comments:    history of 1 ppd for 7-8 years. Currently using nicotine gum to assist stopping chewing tobacco.  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 (Patient not taking: Reported on 02/16/2021)     amphetamine-dextroamphetamine (ADDERALL) 20 MG tablet Take 1 tablet (20 mg total) by mouth 2 (two) times daily. Will need follow-up before next refill. 60 tablet 0   No facility-administered medications prior to visit.    No Known Allergies  ROS Review of Systems  Constitutional: Negative.   HENT: Negative.    Respiratory: Negative.    Cardiovascular: Negative.   Gastrointestinal: Negative.   Psychiatric/Behavioral:  Positive for decreased concentration and sleep disturbance. Negative for behavioral problems and dysphoric mood. The patient is not nervous/anxious.      Depression screen [facility] 2/9 02/16/2021 02/16/2021 04/29/2020  Decreased Interest 0 0 0  Down, Depressed, Hopeless 0 0 0  PHQ - 2 Score 0 0 0  Altered sleeping 2 - -  Tired, decreased energy 2  - -  Change in appetite 0 - -  Feeling bad or failure about yourself  0 - -  Trouble concentrating 1 - -  Moving slowly or fidgety/restless 0 - -  Suicidal thoughts 0 - -  PHQ-9 Score 5 - -  Difficult doing work/chores Not difficult at all - -     Objective:    Physical Exam Vitals and nursing note reviewed.  Constitutional:      General: He is not in acute distress.    Appearance: Normal appearance. He is not ill-appearing, toxic-appearing or diaphoretic.  HENT:     Head: Normocephalic and atraumatic.     Right Ear: Tympanic membrane, ear canal and external ear normal.     Left Ear: Tympanic membrane, ear canal and external ear normal.     Mouth/Throat:     Mouth: Mucous membranes are moist.     Pharynx: Oropharynx is clear. No oropharyngeal exudate or posterior oropharyngeal erythema.   Eyes:     General:        Right eye: No discharge.        Left eye: No discharge.     Extraocular Movements: Extraocular movements intact.     Conjunctiva/sclera: Conjunctivae normal.     Pupils: Pupils are equal, round, and reactive to light.  Neck:     Vascular: No carotid bruit.  Cardiovascular:     Rate and Rhythm: Normal rate and regular rhythm.  Pulmonary:     Effort: Pulmonary effort is normal.     Breath sounds: Normal breath sounds.  Abdominal:     General: Bowel sounds are normal.  Musculoskeletal:     Cervical back: No rigidity or tenderness.  Lymphadenopathy:     Cervical: No cervical adenopathy.  Skin:    General: Skin is warm and dry.  Neurological:     General: No focal deficit present.     Mental Status: He is alert.  Psychiatric:        Mood and Affect: Mood normal.        Behavior: Behavior normal.    BP 122/76 (BP Location: Right Arm, Patient Position: Sitting, Cuff Size: Normal)   Pulse 89   Temp 97.6 F (36.4 C) (Temporal)   Ht 6\' 2"  (1.88 m)   Wt 207 lb 12.8 oz (94.3 kg)   SpO2 97%   BMI 26.68 kg/m  Wt Readings from Last 3 Encounters:  02/16/21  207 lb 12.8 oz (94.3 kg)  09/10/18  185 lb (83.9 kg)  09/08/18 189 lb 14.4 oz (86.1 kg)     Health Maintenance Due  Topic Date Due   HIV Screening  Never done   Hepatitis C Screening  Never done   COLONOSCOPY (Pts 45-2yrs Insurance coverage will need to be confirmed)  01/01/2016   INFLUENZA VACCINE  01/16/2021    There are no preventive care reminders to display for this patient.  Lab Results  Component Value Date   TSH 2.25 09/14/2015   Lab Results  Component Value Date   WBC 4.7 09/04/2018   HGB 15.2 09/04/2018   HCT 43.2 09/04/2018   MCV 91.9 09/04/2018   PLT 245.0 09/04/2018   Lab Results  Component Value Date   NA 137 09/04/2018   K 4.4 09/04/2018   CO2 29 09/04/2018   GLUCOSE 109 (H) 09/04/2018   BUN 11 09/04/2018   CREATININE 0.93 09/04/2018   BILITOT 0.9 09/04/2018   ALKPHOS 50 09/04/2018   AST 27 09/04/2018   ALT 44 09/04/2018   PROT 7.4 09/04/2018   ALBUMIN 5.0 09/04/2018   CALCIUM 10.2 09/04/2018   GFR 85.39 09/04/2018   Lab Results  Component Value Date   CHOL 242 (H) 09/14/2015   Lab Results  Component Value Date   HDL 72.10 09/14/2015   Lab Results  Component Value Date   LDLCALC 150 (H) 09/14/2015   Lab Results  Component Value Date   TRIG 97.0 09/14/2015   Lab Results  Component Value Date   CHOLHDL 3 09/14/2015   Lab Results  Component Value Date   HGBA1C 5.6 09/14/2015      Assessment & Plan:   Problem List Items Addressed This Visit       Other   Attention deficit disorder - Primary   Relevant Medications   atomoxetine (STRATTERA) 40 MG capsule   Other Relevant Orders   Ambulatory referral to Psychiatry   Snores   Relevant Orders   Ambulatory referral to Sleep Studies    Meds ordered this encounter  Medications   atomoxetine (STRATTERA) 40 MG capsule    Sig: Take 1 capsule (40 mg total) by mouth daily.    Dispense:  30 capsule    Refill:  1     Follow-up: Return in about 2 months (around 04/18/2021).    Agrees to give Strattera try.  We will start with 40 mg.  Information was given on Strattera as well as sleep apnea.  Agrees to go for a sleep study.  Return in 2 months fasting we will check labs. Mliss Sax, MD  9/5 addendum: patient is declining Strattera. Suggest psychiatry referral or to [facility].

## 2021-02-21 ENCOUNTER — Telehealth: Payer: Self-pay | Admitting: Family Medicine

## 2021-02-21 NOTE — Telephone Encounter (Signed)
Please advise message below  °

## 2021-02-21 NOTE — Telephone Encounter (Signed)
Patient states Dr. Doreene Burke recently started him on atomoxetine but he has researched this medication and does not wish to take it because of reported side effects. He did not fill the prescription and wishes to change back to Adderall which he had previously been taking. Please call patient to advise.

## 2021-02-21 NOTE — Addendum Note (Signed)
Addended by: Andrez Grime on: 02/21/2021 04:43 PM   Modules accepted: Orders

## 2021-02-22 NOTE — Telephone Encounter (Signed)
Spoke with patient informed that referral has been placed to manage ADD medications. Per patient he does not want to start on new medication that was prescribed, states that his previous provider who have left the practice use to fill his scripts of Adderall patient states that he is not sure why he needs a referral would like to know if Dr. Doreene Burke would be able to fill Rx for Adderall? Please advise

## 2021-04-28 ENCOUNTER — Ambulatory Visit: Payer: No Typology Code available for payment source | Admitting: Family Medicine
